# Patient Record
Sex: Female | Born: 1965 | Race: White | Hispanic: No | Marital: Married | Smoking: Current every day smoker
Health system: Southern US, Community
[De-identification: ages and names within clinical notes are randomized; demographics above are authoritative.]

## PROBLEM LIST (undated history)

## (undated) DIAGNOSIS — E785 Hyperlipidemia, unspecified: Secondary | ICD-10-CM

## (undated) HISTORY — DX: Hyperlipidemia, unspecified: E78.5

## (undated) HISTORY — PX: OTHER SURGICAL HISTORY: SHX169

---

## 2013-02-19 ENCOUNTER — Encounter: Payer: Self-pay | Admitting: Physician Assistant

## 2013-02-19 ENCOUNTER — Ambulatory Visit (INDEPENDENT_AMBULATORY_CARE_PROVIDER_SITE_OTHER): Payer: BC Managed Care – PPO | Admitting: Physician Assistant

## 2013-02-19 VITALS — BP 109/72 | HR 90 | Ht 64.0 in | Wt 145.0 lb

## 2013-02-19 DIAGNOSIS — N951 Menopausal and female climacteric states: Secondary | ICD-10-CM

## 2013-02-19 DIAGNOSIS — Z131 Encounter for screening for diabetes mellitus: Secondary | ICD-10-CM

## 2013-02-19 DIAGNOSIS — N393 Stress incontinence (female) (male): Secondary | ICD-10-CM

## 2013-02-19 DIAGNOSIS — Z1239 Encounter for other screening for malignant neoplasm of breast: Secondary | ICD-10-CM

## 2013-02-19 DIAGNOSIS — Z720 Tobacco use: Secondary | ICD-10-CM

## 2013-02-19 DIAGNOSIS — Z13220 Encounter for screening for lipoid disorders: Secondary | ICD-10-CM

## 2013-02-19 DIAGNOSIS — G4700 Insomnia, unspecified: Secondary | ICD-10-CM

## 2013-02-19 DIAGNOSIS — R232 Flushing: Secondary | ICD-10-CM

## 2013-02-19 DIAGNOSIS — F17200 Nicotine dependence, unspecified, uncomplicated: Secondary | ICD-10-CM

## 2013-02-19 MED ORDER — BUPROPION HCL ER (XL) 150 MG PO TB24: 150.0000 mg | ORAL_TABLET | Freq: Every day | ORAL | Status: DC

## 2013-02-19 NOTE — Patient Instructions (Addendum)
Melatonin 3mg  to 10mg  1 hour before bedtime.  Black cohosh  Menopause and Herbal Products Menopause is the normal time of life when menstrual periods stop completely. Menopause is complete when you have missed 12 consecutive menstrual periods. It usually occurs between the ages of 4648 to 4955, with an average age of 47. Very rarely does a woman develop menopause before 47 years old. At menopause, your ovaries stop producing the female hormones, estrogen and progesterone. This can cause undesirable symptoms and also affect your health. Sometimes the symptoms can occur 4 to 5 years before the menopause begins. There is no relationship between menopause and:  Oral contraceptives.  Number of children you had.  Race.  The age your menstrual periods started (menarche). Heavy smokers and very thin women may develop menopause earlier in life. Estrogen and progesterone hormone treatment is the usual method of treating menopausal symptoms. However, there are women who should not take hormone treatment. This is true of:   Women that have breast or uterine cancer.  Women who prefer not to take hormones because of certain side effects (abnormal uterine bleeding).  Women who are afraid that hormones may cause breast cancer.  Women who have a history of liver disease, heart disease, stroke, or blood clots. For these women, there are other medications that may help treat their menopausal symptoms. These medications are found in plants and botanical products. They can be found in the form of herbs, teas, oils, tinctures, and pills.  CAUSES:  The ovaries stop producing the female hormones estrogen and progesterone.  Other causes include:  Surgery to remove both ovaries.  The ovaries stop functioning for no know reason.  Tumors of the pituitary gland in the brain.  Medical disease that affects the ovaries and hormone production.  Radiation treatment to the abdomen or pelvis.  Chemotherapy that  affects the ovaries. PHYTOESTROGENS: Phytoestrogens occur naturally in plants and plant products. They act like estrogen in the body. Herbal medications are made from these plants and botanical steroids. There are 3 types of phytoestrogens:  Isoflavones (genistein and daidzein) are found in soy, garbanzo beans, miso and tofu foods.  Ligins are found in the shell of seeds. They are used to make oils like flaxseed oil. The bacteria in your intestine act on these foods to produce the estrogen-like hormones.  Coumestans are estrogen-like. Some of the foods they are found in include sunflower seeds and bean sprouts. CONDITIONS AND THEIR POSSIBLE HERBAL TREATMENT:  Hot flashes and night sweats.  Soy, black cohosh and evening primrose.  Irritability, insomnia, depression and memory problems.  Chasteberry, ginseng, and soy.  St. John's wort may be helpful for depression. However, there is a concern of it causing cataracts of the eye and may have bad effects on other medications. St. John's wort should not be taken for long time and without your caregiver's advice.  Loss of libido and vaginal and skin dryness.  Wild yam and soy.  Prevention of coronary heart disease and osteoporosis.  Soy and Isoflavones. Several studies have shown that some women benefit from herbal medications, but most of the studies have not consistently shown that these supplements are much better than placebo. Other forms of treatment to help women with menopausal symptoms include a balanced diet, rest, exercise, vitamin and calcium (with vitamin D) supplements, acupuncture, and group therapy when necessary. THOSE WHO SHOULD NOT TAKE HERBAL MEDICATIONS INCLUDE:  Women who are planning on getting pregnant unless told by your caregiver.  Women who are  breastfeeding unless told by your caregiver.  Women who are taking other prescription medications unless told by your caregiver.  Infants, children, and elderly women  unless told by your caregiver. Different herbal medications have different and unmeasured amounts of the herbal ingredients. There are no regulations, quality control, and standardization of the ingredients in herbal medications. Therefore, the amount of the ingredient in the medication may vary from one herb, pill, tea, oil or tincture to another. Many herbal medications can cause serious problems and can even have poisonous effects if taken too much or too long. If problems develop, the medication should be stopped and recorded by your caregiver. HOME CARE INSTRUCTIONS  Do not take or give children herbal medications without your caregiver's advice.  Let your caregiver know all the medications you are taking. This includes prescription, over-the-counter, eye drops, and creams.  Do not take herbal medications longer or more than recommended.  Tell your caregiver about any side effects from the medication. SEEK MEDICAL CARE IF:  You develop a fever of 102 F (38.9 C), or as directed by your caregiver.  You feel sick to your stomach (nauseous), vomit, or have diarrhea.  You develop a rash.  You develop abdominal pain.  You develop severe headaches.  You start to have vision problems.  You feel dizzy or faint.  You start to feel numbness in any part of your body.  You start shaking (have convulsions). Document Released: 07/19/2007 Document Revised: 01/17/2012 Document Reviewed: 02/15/2010 [facility] Patient Information 2014 [place], [place].   Insomnia Insomnia is frequent trouble falling and/or staying asleep. Insomnia can be a long term problem or a short term problem. Both are common. Insomnia can be a short term problem when the wakefulness is related to a certain stress or worry. Long term insomnia is often related to ongoing stress during waking hours and/or poor sleeping habits. Overtime, sleep deprivation itself can make the problem worse. Every little thing feels more severe  because you are overtired and your ability to cope is decreased. CAUSES   Stress, anxiety, and depression.  Poor sleeping habits.  Distractions such as TV in the bedroom.  Naps close to bedtime.  Engaging in emotionally charged conversations before bed.  Technical reading before sleep.  Alcohol and other sedatives. They may make the problem worse. They can hurt normal sleep patterns and normal dream activity.  Stimulants such as caffeine for several hours prior to bedtime.  Pain syndromes and shortness of breath can cause insomnia.  Exercise late at night.  Changing time zones may cause sleeping problems (jet lag). It is sometimes helpful to have someone observe your sleeping patterns. They should look for periods of not breathing during the night (sleep apnea). They should also look to see how long those periods last. If you live alone or observers are uncertain, you can also be observed at a sleep clinic where your sleep patterns will be professionally monitored. Sleep apnea requires a checkup and treatment. Give your caregivers your medical history. Give your caregivers observations your family has made about your sleep.  SYMPTOMS   Not feeling rested in the morning.  Anxiety and restlessness at bedtime.  Difficulty falling and staying asleep. TREATMENT   Your caregiver may prescribe treatment for an underlying medical disorders. Your caregiver can give advice or help if you are using alcohol or other drugs for self-medication. Treatment of underlying problems will usually eliminate insomnia problems.  Medications can be prescribed for short time use. They are generally not  recommended for lengthy use.  Over-the-counter sleep medicines are not recommended for lengthy use. They can be habit forming.  You can promote easier sleeping by making lifestyle changes such as:  Using relaxation techniques that help with breathing and reduce muscle tension.  Exercising earlier in  the day.  Changing your diet and the time of your last meal. No night time snacks.  Establish a regular time to go to bed.  Counseling can help with stressful problems and worry.  Soothing music and white noise may be helpful if there are background noises you cannot remove.  Stop tedious detailed work at least one hour before bedtime. HOME CARE INSTRUCTIONS   Keep a diary. Inform your caregiver about your progress. This includes any medication side effects. See your caregiver regularly. Take note of:  Times when you are asleep.  Times when you are awake during the night.  The quality of your sleep.  How you feel the next day. This information will help your caregiver care for you.  Get out of bed if you are still awake after 15 minutes. Read or do some quiet activity. Keep the lights down. Wait until you feel sleepy and go back to bed.  Keep regular sleeping and waking hours. Avoid naps.  Exercise regularly.  Avoid distractions at bedtime. Distractions include watching television or engaging in any intense or detailed activity like attempting to balance the household checkbook.  Develop a bedtime ritual. Keep a familiar routine of bathing, brushing your teeth, climbing into bed at the same time each night, listening to soothing music. Routines increase the success of falling to sleep faster.  Use relaxation techniques. This can be using breathing and muscle tension release routines. It can also include visualizing peaceful scenes. You can also help control troubling or intruding thoughts by keeping your mind occupied with boring or repetitive thoughts like the old concept of counting sheep. You can make it more creative like imagining planting one beautiful flower after another in your backyard garden.  During your day, work to eliminate stress. When this is not possible use some of the previous suggestions to help reduce the anxiety that accompanies stressful situations. MAKE  SURE YOU:   Understand these instructions.  Will watch your condition.  Will get help right away if you are not doing well or get worse. Document Released: 01/28/2000 Document Revised: 04/24/2011 Document Reviewed: 02/27/2007 [facility] Patient Information 2014 [place], [place].

## 2013-02-21 ENCOUNTER — Encounter: Payer: Self-pay | Admitting: Physician Assistant

## 2013-02-21 ENCOUNTER — Other Ambulatory Visit (HOSPITAL_COMMUNITY)
Admission: RE | Admit: 2013-02-21 | Discharge: 2013-02-21 | Disposition: A | Discharge status: Home / Self Care | Payer: BC Managed Care – PPO | Source: Ambulatory Visit | Attending: Family Medicine | Admitting: Family Medicine

## 2013-02-21 ENCOUNTER — Ambulatory Visit (INDEPENDENT_AMBULATORY_CARE_PROVIDER_SITE_OTHER): Payer: BC Managed Care – PPO | Admitting: Physician Assistant

## 2013-02-21 VITALS — BP 120/67 | HR 90 | Wt 145.0 lb

## 2013-02-21 DIAGNOSIS — F17200 Nicotine dependence, unspecified, uncomplicated: Secondary | ICD-10-CM

## 2013-02-21 DIAGNOSIS — Z0000 Encounter for general adult medical examination without abnormal findings: Secondary | ICD-10-CM

## 2013-02-21 DIAGNOSIS — Z01419 Encounter for gynecological examination (general) (routine) without abnormal findings: Secondary | ICD-10-CM | POA: Insufficient documentation

## 2013-02-21 DIAGNOSIS — Z1239 Encounter for other screening for malignant neoplasm of breast: Secondary | ICD-10-CM

## 2013-02-21 DIAGNOSIS — Z1151 Encounter for screening for human papillomavirus (HPV): Secondary | ICD-10-CM | POA: Insufficient documentation

## 2013-02-21 DIAGNOSIS — Z23 Encounter for immunization: Secondary | ICD-10-CM

## 2013-02-21 LAB — LIPID PANEL
CHOL/HDL RATIO: 5.8 ratio
Cholesterol: 248 mg/dL — ABNORMAL HIGH (ref 0–200)
HDL: 43 mg/dL (ref >39)
LDL CALC: 155 mg/dL — AB (ref 0–99)
Triglycerides: 252 mg/dL — ABNORMAL HIGH (ref <150)
VLDL: 50 mg/dL — ABNORMAL HIGH (ref 0–40)

## 2013-02-21 LAB — COMPLETE METABOLIC PANEL WITH GFR
ALK PHOS: 66 U/L (ref 39–117)
ALT: 9 U/L (ref 0–35)
AST: 13 U/L (ref 0–37)
Albumin: 4.3 g/dL (ref 3.5–5.2)
BILIRUBIN TOTAL: 0.5 mg/dL (ref 0.3–1.2)
BUN: 18 mg/dL (ref 6–23)
CO2: 29 mEq/L (ref 19–32)
CREATININE: 0.78 mg/dL (ref 0.50–1.10)
Calcium: 9.6 mg/dL (ref 8.4–10.5)
Chloride: 104 mEq/L (ref 96–112)
GFR, Est African American: >89 mL/min
GFR, Est Non African American: >89 mL/min
Glucose, Bld: 100 mg/dL — ABNORMAL HIGH (ref 70–99)
Potassium: 4 mEq/L (ref 3.5–5.3)
SODIUM: 139 meq/L (ref 135–145)
TOTAL PROTEIN: 7 g/dL (ref 6.0–8.3)

## 2013-02-21 NOTE — Progress Notes (Signed)
  Subjective:     Yetta GlassmanSusan Ken is a 48 y.o. female and is here for a comprehensive physical exam. The patient reports no problems.  History   Social History  . Marital Status: Married    Spouse Name: N/A    Number of Children: N/A  . Years of Education: N/A   Occupational History  . Not on file.   Social History Main Topics  . Smoking status: Current Every Day Smoker  . Smokeless tobacco: Not on file  . Alcohol Use: No  . Drug Use: Yes  . Sexual Activity: Not Currently   Other Topics Concern  . Not on file   Social History Narrative  . No narrative on file   Health Maintenance  Topic Date Due  . Influenza Vaccine  02/21/2014  . Pap Smear  02/22/2016  . Tetanus/tdap  02/22/2023    The following portions of the patient's history were reviewed and updated as appropriate: allergies, current medications, past family history, past medical history, past social history, past surgical history and problem list.  Review of Systems A comprehensive review of systems was negative.   Objective:    BP 120/67  Pulse 90  Wt 145 lb (65.772 kg)  LMP 02/11/2013 General appearance: alert, cooperative and appears older than stated age Head: Normocephalic, without obvious abnormality, atraumatic Eyes: conjunctivae/corneas clear. PERRL, EOM's intact. Fundi benign., exam for left eye only. right eye is glass.  Ears: normal TM's and external ear canals both ears Nose: Nares normal. Septum midline. Mucosa normal. No drainage or sinus tenderness. Throat: lips, mucosa, and tongue normal; teeth and gums normal Neck: no adenopathy, no carotid bruit, no JVD, supple, symmetrical, trachea midline and thyroid not enlarged, symmetric, no tenderness/mass/nodules Back: symmetric, no curvature. ROM normal. No CVA tenderness. Lungs: clear to auscultation bilaterally Breasts: normal appearance, no masses or tenderness Heart: regular rate and rhythm, S1, S2 normal, no murmur, click, rub or  gallop Abdomen: soft, non-tender; bowel sounds normal; no masses,  no organomegaly Pelvic: cervix normal in appearance, external genitalia normal, no adnexal masses or tenderness, no cervical motion tenderness, uterus normal size, shape, and consistency and vagina normal without discharge Extremities: extremities normal, atraumatic, no cyanosis or edema Pulses: 2+ and symmetric Skin: Skin color, texture, turgor normal. No rashes or lesions Lymph nodes: Cervical, supraclavicular, and axillary nodes normal. Neurologic: Grossly normal    Assessment:    Healthy female exam.      Plan:    CpE- Pap smear done today. Mammogram is scheduled for Tuesday. Tdap given today. We'll call with screening lab results. Will get chest x-ray due to 30 years of smoking. Encouraged calcium 1200 mg M.D. 800 mg. Discussed bone denisity in next year since mother had early osteoporosis. Pt screening 9 years ago and looked great. Continue on wellbutrin for smoking cessation. Call if would like to discuss other options.   See After Visit Summary for Counseling Recommendations
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
## 2013-02-21 NOTE — Patient Instructions (Addendum)
Keeping You Healthy  Get These Tests 1. Blood Pressure- Have your blood pressure checked once a year by your health care provider.  Normal blood pressure is 120/80. 2. Weight- Have your body mass index (BMI) calculated to screen for obesity.  BMI is measure of body fat based on height and weight.  You can also calculate your own BMI at www.nhlbisupport.com/bmi/. 3. Cholesterol- Have your cholesterol checked every 5 years starting at age 47 then yearly starting at age 45. 4. Chlamydia, HIV, and other sexually transmitted diseases- Get screened every year until age 25, then within three months of each new sexual provider. 5. Pap Smear- Every 1-3 years; discuss with your health care provider. 6. Mammogram- Every year starting at age 40  Take these medicines  Calcium with Vitamin D-Your body needs 1200 mg of Calcium each day and 800-1000 IU of Vitamin D daily.  Your body can only absorb 500 mg of Calcium at a time so Calcium must be taken in 2 or 3 divided doses throughout the day.  Multivitamin with folic acid- Once daily if it is possible for you to become pregnant.  Get these Immunizations  Tetanus shot- Every 10 years.  Flu shot-Every year.  Take these steps 1. Do not smoke-Your healthcare provider can help you quit.  For tips on how to quit go to www.smokefree.gov or call 1-800 QUITNOW. 2. Be physically active- Exercise 5 days a week for at least 30 minutes.  If you are not already physically active, start slow and gradually work up to 30 minutes of moderate physical activity.  Examples of moderate activity include walking briskly, dancing, swimming, bicycling, etc. 3. Breast Cancer- A self breast exam every month is important for early detection of breast cancer.  For more information and instruction on self breast exams, ask your healthcare provider or www.womenshealth.gov/faq/breast-self-exam.cfm. 4. Eat a healthy diet- Eat a variety of healthy foods such as fruits, vegetables, whole  grains, low fat milk, low fat cheeses, yogurt, lean meats, poultry and fish, beans, nuts, tofu, etc.  For more information go to www. Thenutritionsource.org 5. Drink alcohol in moderation- Limit alcohol intake to one drink or less per day. Never drink and drive. 6. Depression- Your emotional health is as important as your physical health.  If you're feeling down or losing interest in things you normally enjoy please talk to your healthcare provider about being screened for depression. 7. Dental visit- Brush and floss your teeth twice daily; visit your dentist twice a year. 8. Eye doctor- Get an eye exam at least every 2 years. 9. Helmet use- Always wear a helmet when riding a bicycle, motorcycle, rollerblading or skateboarding. 10. Safe sex- If you may be exposed to sexually transmitted infections, use a condom. 11. Seat belts- Seat belts can save your live; always wear one. 12. Smoke/Carbon Monoxide detectors- These detectors need to be installed on the appropriate level of your home. Replace batteries at least once a year. 13. Skin cancer- When out in the sun please cover up and use sunscreen 15 SPF or higher. 14. Violence- If anyone is threatening or hurting you, please tell your healthcare provider.        

## 2013-02-24 ENCOUNTER — Other Ambulatory Visit: Payer: Self-pay | Admitting: Physician Assistant

## 2013-02-24 MED ORDER — PRAVASTATIN SODIUM 40 MG PO TABS: 40.0000 mg | ORAL_TABLET | Freq: Every day | ORAL | Status: DC

## 2013-02-25 ENCOUNTER — Ambulatory Visit (INDEPENDENT_AMBULATORY_CARE_PROVIDER_SITE_OTHER): Payer: BC Managed Care – PPO

## 2013-02-25 DIAGNOSIS — R7301 Impaired fasting glucose: Secondary | ICD-10-CM

## 2013-02-25 DIAGNOSIS — F17200 Nicotine dependence, unspecified, uncomplicated: Secondary | ICD-10-CM

## 2013-02-25 DIAGNOSIS — Z1239 Encounter for other screening for malignant neoplasm of breast: Secondary | ICD-10-CM

## 2013-10-28 ENCOUNTER — Encounter: Payer: Self-pay | Admitting: Family Medicine

## 2013-10-28 ENCOUNTER — Ambulatory Visit (INDEPENDENT_AMBULATORY_CARE_PROVIDER_SITE_OTHER): Payer: BC Managed Care – PPO | Admitting: Family Medicine

## 2013-10-28 VITALS — BP 112/74 | HR 99 | Wt 142.0 lb

## 2013-10-28 DIAGNOSIS — J019 Acute sinusitis, unspecified: Secondary | ICD-10-CM

## 2013-10-28 DIAGNOSIS — Z970 Presence of artificial eye: Secondary | ICD-10-CM | POA: Diagnosis not present

## 2013-10-28 MED ORDER — AZITHROMYCIN 250 MG PO TABS: ORAL_TABLET | ORAL | Status: DC

## 2013-10-28 NOTE — Patient Instructions (Signed)

## 2013-10-28 NOTE — Progress Notes (Signed)
   Subjective:    Patient ID: Stacie Hanson, female    DOB: [birth year], 48 y.o.   MRN: [id number]  HPI Last week her allergies started flaring.  Took some benadryl a few days.  Has been having post nasal drip. Says getting yellow drainage out of her nose.  She feel like she is having green d/c from her eye socket.  She has a prosthesis  Some sweats. No fever.  + post nasal drip.  Facial pressure that throbs and is worse when she bends over. She did take some NyQuil last night and this morning and says she's also been using nasal saline.   Review of Systems     Objective:   Physical Exam  Constitutional: She is oriented to person, place, and time. She appears well-developed and well-nourished.  HENT:  Head: Normocephalic and atraumatic.  Right Ear: External ear normal.  Left Ear: External ear normal.  Nose: Nose normal.  Mouth/Throat: Oropharynx is clear and moist.  TMs and canals are clear. Right upper eyelid swollen. No discharge seen. Tender over the bilat maxillary sinuses and frontal sinuses.  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  Eyes: Conjunctivae and EOM are normal. Pupils are equal, round, and reactive to light. Right eye exhibits no discharge. Left eye exhibits no discharge.  Neck: Neck supple. No thyromegaly present.  Cardiovascular: Normal rate, regular rhythm and normal heart sounds.   Pulmonary/Chest: Effort normal and breath sounds normal. She has no wheezes.  Lymphadenopathy:    She has no cervical adenopathy.  Neurological: She is alert and oriented to person, place, and time.  Skin: Skin is warm and dry.  Psychiatric: She has a normal mood and affect.          Assessment & Plan:  Acute sinusitis-will treat with azithromycin. Call if not significantly better in one week. Continue symptomatic care.  Allergic rhinitis-recommend a longer acting antihistamine such as Claritin or Allegra to help control her symptoms instead of just Benadryl occasionally at night.  Especially since she tends to have follow allergies it classically turned into a sinus infection. We also discussed starting a nasal steroid spray such as Flonase could be helpful in study showed it does reduce recurrence of sinus infections.

## 2013-12-08 ENCOUNTER — Other Ambulatory Visit: Payer: Self-pay | Admitting: Physician Assistant

## 2014-01-19 ENCOUNTER — Other Ambulatory Visit: Payer: Self-pay | Admitting: Physician Assistant

## 2014-01-29 ENCOUNTER — Other Ambulatory Visit: Payer: Self-pay | Admitting: *Deleted

## 2014-01-29 MED ORDER — PRAVASTATIN SODIUM 40 MG PO TABS: ORAL_TABLET | ORAL | Status: DC

## 2014-02-18 ENCOUNTER — Encounter: Payer: BC Managed Care – PPO | Admitting: Physician Assistant

## 2014-02-18 ENCOUNTER — Telehealth: Payer: Self-pay | Admitting: *Deleted

## 2014-02-18 DIAGNOSIS — Z1231 Encounter for screening mammogram for malignant neoplasm of breast: Secondary | ICD-10-CM

## 2014-02-18 NOTE — Telephone Encounter (Signed)
Mammogram ordered for upcoming CPE.

## 2014-02-25 ENCOUNTER — Ambulatory Visit (INDEPENDENT_AMBULATORY_CARE_PROVIDER_SITE_OTHER): Payer: BLUE CROSS/BLUE SHIELD | Admitting: Physician Assistant

## 2014-02-25 ENCOUNTER — Ambulatory Visit: Payer: Self-pay

## 2014-02-25 ENCOUNTER — Encounter: Payer: Self-pay | Admitting: Physician Assistant

## 2014-02-25 VITALS — BP 105/67 | HR 98 | Ht 64.0 in | Wt 140.0 lb

## 2014-02-25 DIAGNOSIS — Z131 Encounter for screening for diabetes mellitus: Secondary | ICD-10-CM

## 2014-02-25 DIAGNOSIS — B001 Herpesviral vesicular dermatitis: Secondary | ICD-10-CM | POA: Insufficient documentation

## 2014-02-25 DIAGNOSIS — E785 Hyperlipidemia, unspecified: Secondary | ICD-10-CM | POA: Insufficient documentation

## 2014-02-25 DIAGNOSIS — Z0000 Encounter for general adult medical examination without abnormal findings: Secondary | ICD-10-CM

## 2014-02-25 DIAGNOSIS — Z970 Presence of artificial eye: Secondary | ICD-10-CM

## 2014-02-25 DIAGNOSIS — Z9189 Other specified personal risk factors, not elsewhere classified: Secondary | ICD-10-CM

## 2014-02-25 DIAGNOSIS — R5383 Other fatigue: Secondary | ICD-10-CM

## 2014-02-25 LAB — COMPLETE METABOLIC PANEL WITH GFR
ALK PHOS: 68 U/L (ref 39–117)
ALT: 13 U/L (ref 0–35)
AST: 15 U/L (ref 0–37)
Albumin: 4.1 g/dL (ref 3.5–5.2)
BILIRUBIN TOTAL: 0.6 mg/dL (ref 0.2–1.2)
BUN: 15 mg/dL (ref 6–23)
CO2: 29 meq/L (ref 19–32)
CREATININE: 0.8 mg/dL (ref 0.50–1.10)
Calcium: 9.4 mg/dL (ref 8.4–10.5)
Chloride: 104 mEq/L (ref 96–112)
GFR, EST NON AFRICAN AMERICAN: 88 mL/min
GLUCOSE: 96 mg/dL (ref 70–99)
Potassium: 4.5 mEq/L (ref 3.5–5.3)
Sodium: 140 mEq/L (ref 135–145)
Total Protein: 7.1 g/dL (ref 6.0–8.3)

## 2014-02-25 LAB — LIPID PANEL
CHOLESTEROL: 198 mg/dL (ref 0–200)
HDL: 49 mg/dL (ref >39)
LDL CALC: 127 mg/dL — AB (ref 0–99)
Total CHOL/HDL Ratio: 4 Ratio
Triglycerides: 111 mg/dL (ref <150)
VLDL: 22 mg/dL (ref 0–40)

## 2014-02-25 LAB — TSH: TSH: 1.776 u[IU]/mL (ref 0.350–4.500)

## 2014-02-25 LAB — VITAMIN B12: VITAMIN B 12: 321 pg/mL (ref 211–911)

## 2014-02-25 MED ORDER — VALACYCLOVIR HCL 1 G PO TABS: ORAL_TABLET | ORAL | Status: DC

## 2014-02-25 NOTE — Progress Notes (Signed)
Subjective:    Patient ID: Stacie GlassmanSusan Leas, female    DOB: 06/18/1965, 49 y.o.   MRN: [id number]  HPI    Review of Systems     Objective:   Physical Exam        Assessment & Plan:   Subjective:     Stacie GlassmanSusan Goertzen is a 49 y.o. female and is here for a comprehensive physical exam. The patient reports no problems.  Patient at the close of visit mentions that she seems tired most of the time. 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 She just wanted to make sure her labs looked okay. She reports she is sleeping well at night and wakes up feeling rested.  History   Social History  . Marital Status: Married    Spouse Name: N/A    Number of Children: N/A  . Years of Education: N/A   Occupational History  . Not on file.   Social History Main Topics  . Smoking status: Current Every Day Smoker  . Smokeless tobacco: Not on file  . Alcohol Use: No  . Drug Use: Yes  . Sexual Activity: Not Currently   Other Topics Concern  . Not on file   Social History Narrative   Health Maintenance  Topic Date Due  . MAMMOGRAM  02/25/2014  . INFLUENZA VACCINE  02/26/2015 (Originally 09/13/2013)  . PAP SMEAR  02/22/2016  . TETANUS/TDAP  02/22/2023    The following portions of the patient's history were reviewed and updated as appropriate: allergies, current medications, past family history, past medical history, past social history, past surgical history and problem list.  Review of Systems A comprehensive review of systems was negative.   Objective:    BP 105/67 mmHg  Pulse 98  Ht 5\' 4"  (1.626 m)  Wt 140 lb (63.504 kg)  BMI 24.02 kg/m2 General appearance: alert, cooperative and appears stated age Head: Normocephalic, without obvious abnormality, atraumatic Eyes: conjunctivae/corneas clear. PERRL, EOM's intact. Fundi benign. right eye is glass.  Ears: normal TM's and external ear canals both ears Nose: Nares normal. Septum midline. Mucosa normal. No drainage  or sinus tenderness. Throat: lips, mucosa, and tongue normal; teeth and gums normal Neck: no adenopathy, no carotid bruit, no JVD, supple, symmetrical, trachea midline and thyroid not enlarged, symmetric, no tenderness/mass/nodules Back: symmetric, no curvature. ROM normal. No CVA tenderness. Lungs: clear to auscultation bilaterally Heart: regular rate and rhythm, S1, S2 normal, no murmur, click, rub or gallop Abdomen: soft, non-tender; bowel sounds normal; no masses,  no organomegaly Extremities: extremities normal, atraumatic, no cyanosis or edema Pulses: 2+ and symmetric Skin: Skin color, texture, turgor normal. No rashes or lesions or lots of age spots on bilateral arms and face  crusted vesicle that has scabbed over on lower left lip.  Lymph nodes: Cervical, supraclavicular, and axillary nodes normal. Neurologic: Grossly normal    Assessment:    Healthy female exam.      Plan:    CPE- patient declines flu shot. Tdap up to date. Screening mammogram scheduled. Will add bone density since she is at risk for osteoporosis due to smoking in her family history. Discussed regular exercise at 150 minutes a week as well as vitamin D and calcium 800 units and 1200 mg daily. Handout given. Will screen for diabetes today.  Fatigue- briefly mention she is fatigued. We'll check vitamin D, B12 and thyroid.  Hyperlipidemia-we'll recheck cholesterol today. Patient is on Pravachol.  Fever blister-treated with Valtrex  a day. Gave her enough so that she could start at the beginning signs of fever blister.   See After Visit Summary for Counseling Recommendations

## 2014-02-25 NOTE — Patient Instructions (Signed)

## 2014-02-26 ENCOUNTER — Other Ambulatory Visit: Payer: Self-pay | Admitting: Physician Assistant

## 2014-02-26 LAB — VITAMIN D 25 HYDROXY (VIT D DEFICIENCY, FRACTURES): VIT D 25 HYDROXY: 30 ng/mL (ref 30–100)

## 2014-02-26 MED ORDER — PRAVASTATIN SODIUM 40 MG PO TABS: ORAL_TABLET | ORAL | Status: DC

## 2014-03-04 ENCOUNTER — Ambulatory Visit (INDEPENDENT_AMBULATORY_CARE_PROVIDER_SITE_OTHER): Payer: BLUE CROSS/BLUE SHIELD

## 2014-03-04 DIAGNOSIS — Z9189 Other specified personal risk factors, not elsewhere classified: Secondary | ICD-10-CM

## 2014-03-04 DIAGNOSIS — Z13820 Encounter for screening for osteoporosis: Secondary | ICD-10-CM

## 2014-03-04 DIAGNOSIS — Z1231 Encounter for screening mammogram for malignant neoplasm of breast: Secondary | ICD-10-CM

## 2015-04-19 ENCOUNTER — Other Ambulatory Visit: Payer: Self-pay | Admitting: Physician Assistant

## 2015-09-29 IMAGING — CR DG CHEST 2V
2 series · 2 of 2 positions shown · non-contrast
Comparison: None.

CLINICAL DATA: Tobacco use

EXAM:
CHEST  2 VIEW

[view not recorded (1 of 2)]
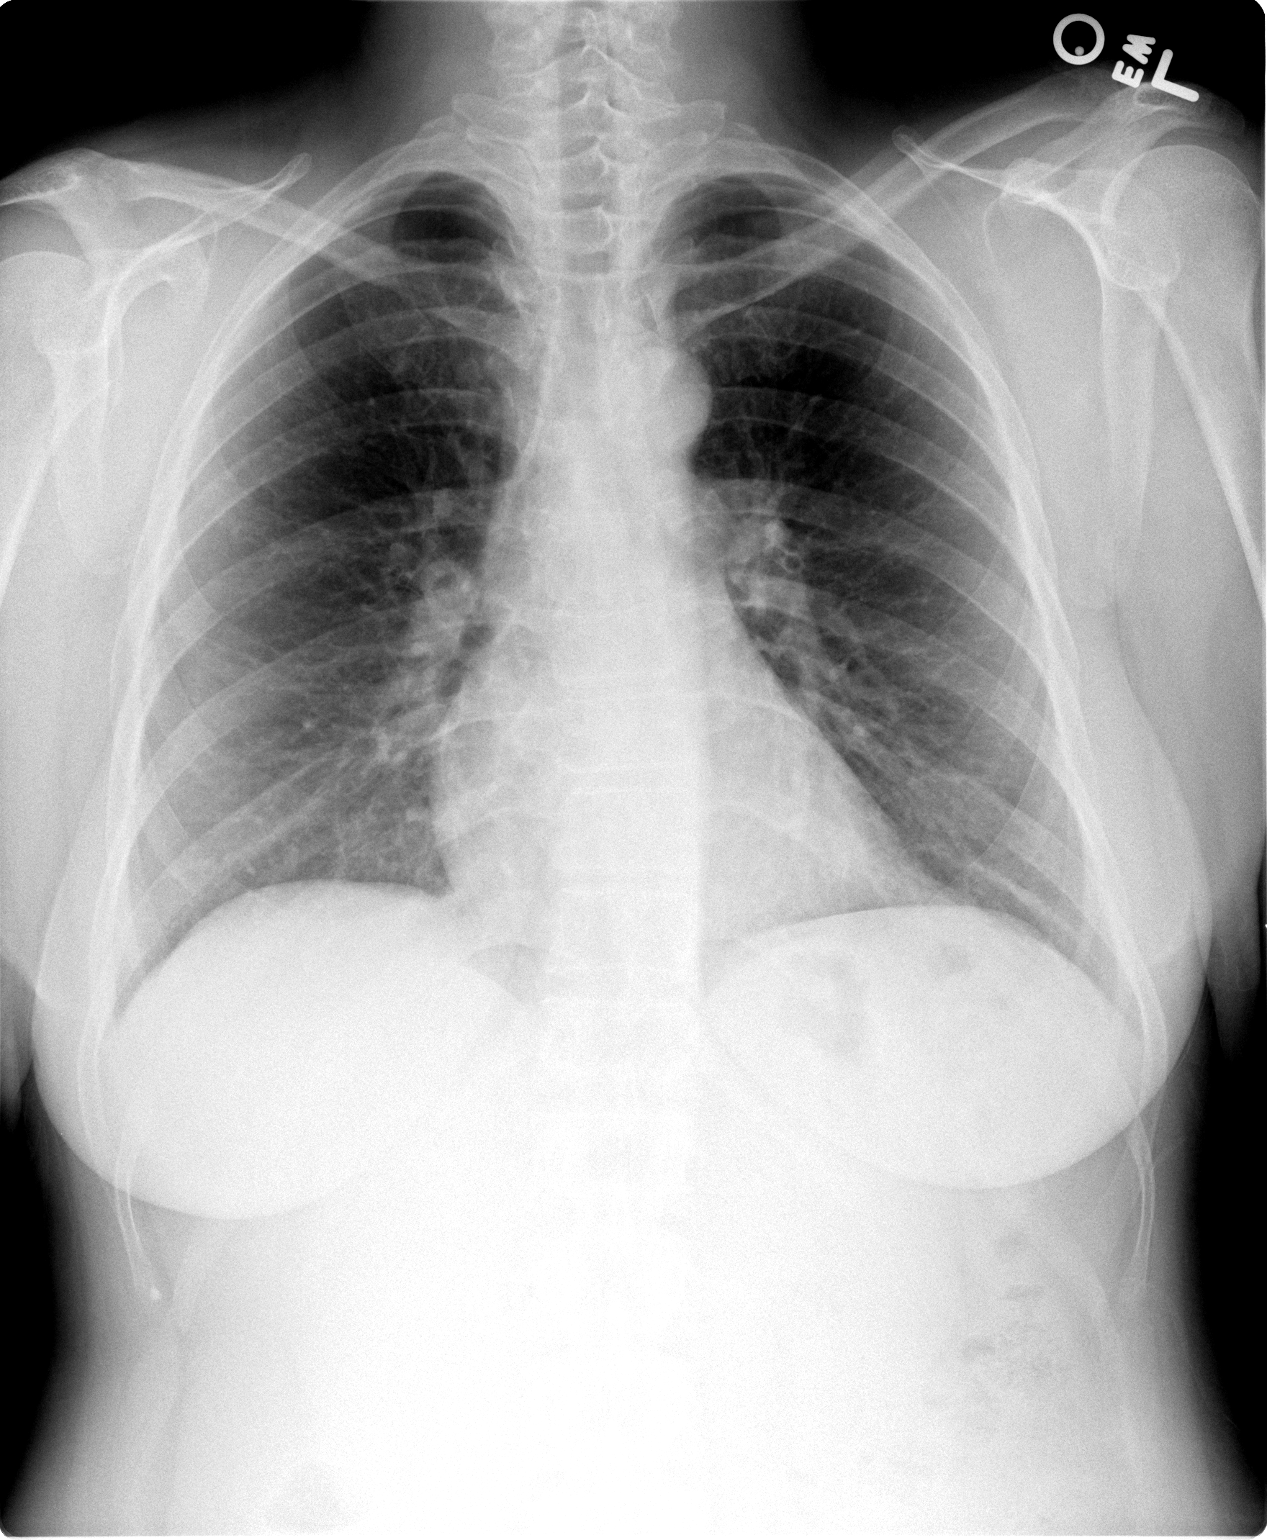

[view not recorded (2 of 2)]
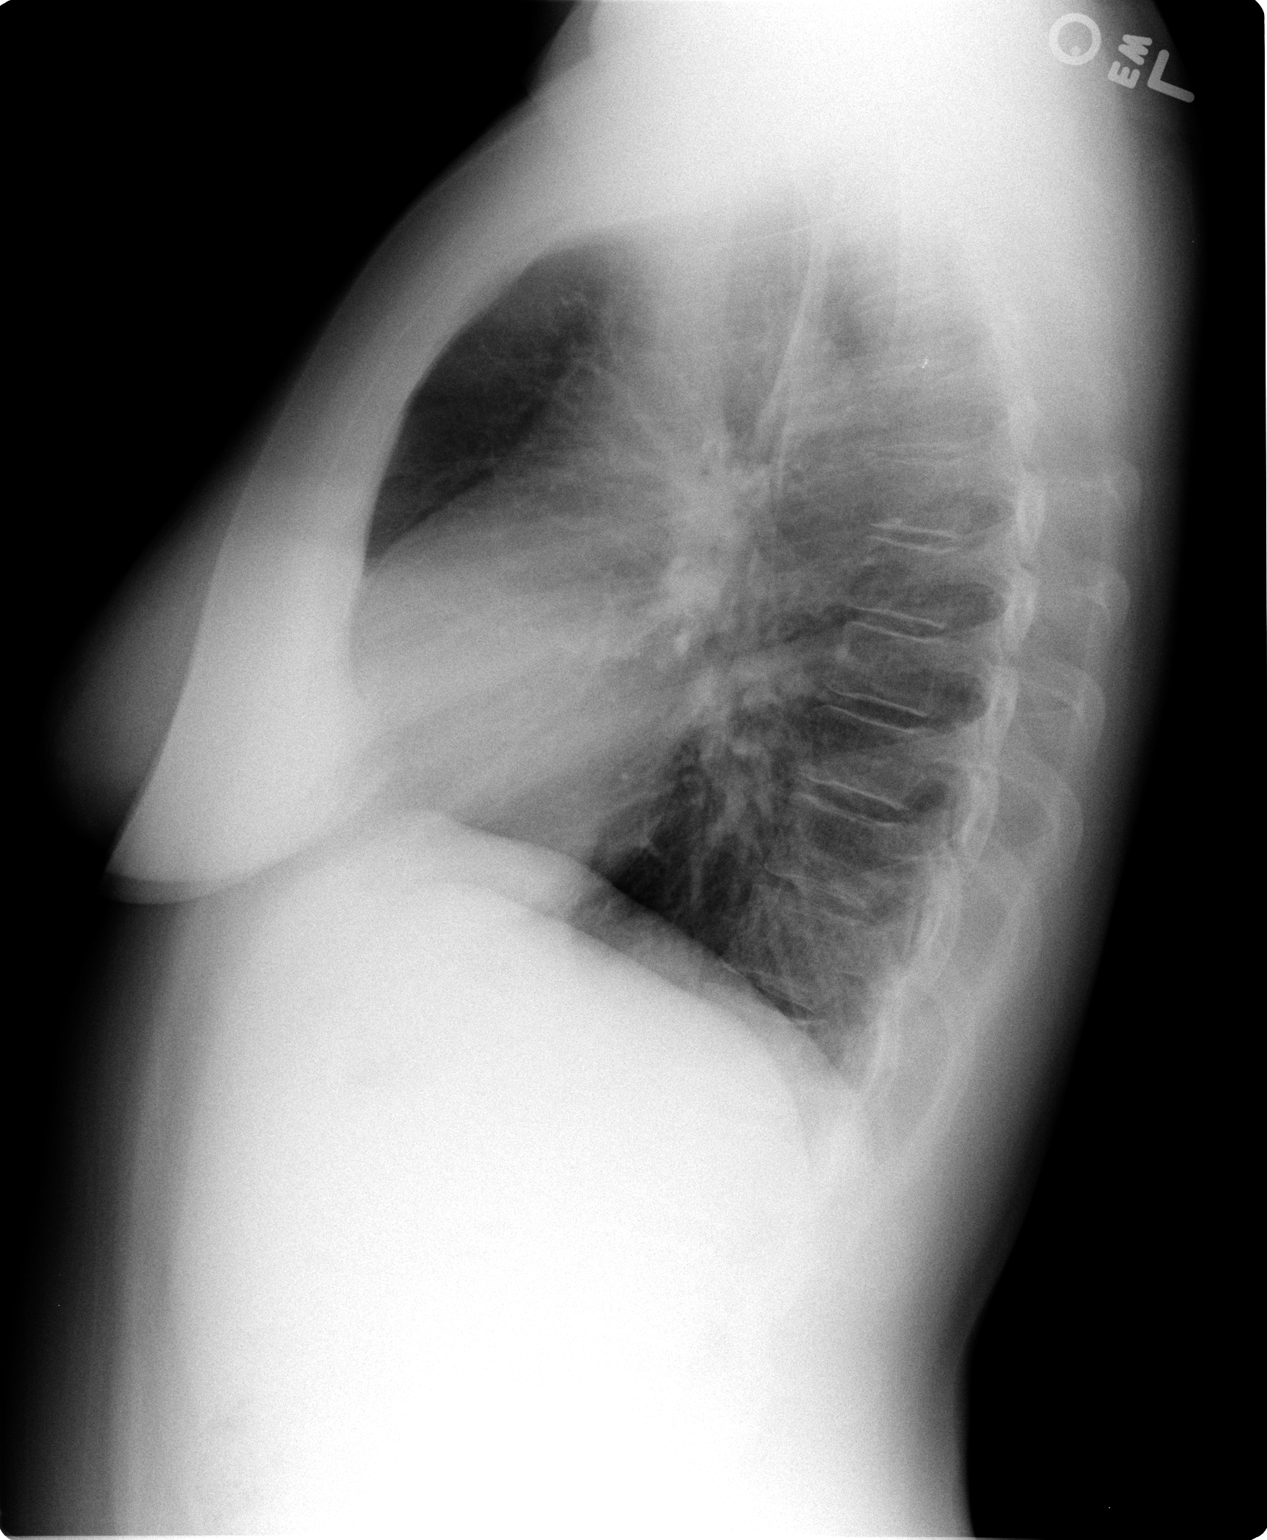

[2 of 2 positions shown; findings below may reference images not displayed]

FINDINGS: The lungs appear clear.  Cardiac and mediastinal contours normal.

No pleural effusion identified.
IMPRESSION: No active cardiopulmonary disease.

## 2016-03-08 ENCOUNTER — Encounter: Payer: Self-pay | Admitting: Physician Assistant

## 2016-03-08 ENCOUNTER — Ambulatory Visit (INDEPENDENT_AMBULATORY_CARE_PROVIDER_SITE_OTHER): Payer: BLUE CROSS/BLUE SHIELD

## 2016-03-08 ENCOUNTER — Other Ambulatory Visit (HOSPITAL_COMMUNITY)
Admission: RE | Admit: 2016-03-08 | Discharge: 2016-03-08 | Disposition: A | Discharge status: Home / Self Care | Payer: BLUE CROSS/BLUE SHIELD | Source: Ambulatory Visit | Attending: Physician Assistant | Admitting: Physician Assistant

## 2016-03-08 ENCOUNTER — Ambulatory Visit (INDEPENDENT_AMBULATORY_CARE_PROVIDER_SITE_OTHER): Payer: BLUE CROSS/BLUE SHIELD | Admitting: Physician Assistant

## 2016-03-08 VITALS — BP 132/83 | HR 99 | Wt 148.0 lb

## 2016-03-08 DIAGNOSIS — Z1231 Encounter for screening mammogram for malignant neoplasm of breast: Secondary | ICD-10-CM | POA: Diagnosis not present

## 2016-03-08 DIAGNOSIS — Z0000 Encounter for general adult medical examination without abnormal findings: Secondary | ICD-10-CM

## 2016-03-08 DIAGNOSIS — Z113 Encounter for screening for infections with a predominantly sexual mode of transmission: Secondary | ICD-10-CM | POA: Diagnosis present

## 2016-03-08 DIAGNOSIS — E785 Hyperlipidemia, unspecified: Secondary | ICD-10-CM | POA: Diagnosis not present

## 2016-03-08 DIAGNOSIS — Z23 Encounter for immunization: Secondary | ICD-10-CM

## 2016-03-08 DIAGNOSIS — Z1151 Encounter for screening for human papillomavirus (HPV): Secondary | ICD-10-CM | POA: Insufficient documentation

## 2016-03-08 DIAGNOSIS — M25542 Pain in joints of left hand: Secondary | ICD-10-CM

## 2016-03-08 DIAGNOSIS — Z01419 Encounter for gynecological examination (general) (routine) without abnormal findings: Secondary | ICD-10-CM | POA: Insufficient documentation

## 2016-03-08 DIAGNOSIS — L814 Other melanin hyperpigmentation: Secondary | ICD-10-CM

## 2016-03-08 DIAGNOSIS — M25541 Pain in joints of right hand: Secondary | ICD-10-CM

## 2016-03-08 DIAGNOSIS — Z1321 Encounter for screening for nutritional disorder: Secondary | ICD-10-CM | POA: Diagnosis not present

## 2016-03-08 DIAGNOSIS — Z1239 Encounter for other screening for malignant neoplasm of breast: Secondary | ICD-10-CM

## 2016-03-08 DIAGNOSIS — Z124 Encounter for screening for malignant neoplasm of cervix: Secondary | ICD-10-CM

## 2016-03-08 DIAGNOSIS — L821 Other seborrheic keratosis: Secondary | ICD-10-CM

## 2016-03-08 LAB — CBC WITH DIFFERENTIAL/PLATELET
BASOS ABS: 79 {cells}/uL (ref 0–200)
BASOS PCT: 1 %
EOS ABS: 474 {cells}/uL (ref 15–500)
Eosinophils Relative: 6 %
HCT: 40.9 % (ref 35.0–45.0)
HEMOGLOBIN: 13.6 g/dL (ref 11.7–15.5)
Lymphocytes Relative: 29 %
Lymphs Abs: 2291 cells/uL (ref 850–3900)
MCH: 31.4 pg (ref 27.0–33.0)
MCHC: 33.3 g/dL (ref 32.0–36.0)
MCV: 94.5 fL (ref 80.0–100.0)
MONOS PCT: 8 %
MPV: 10.3 fL (ref 7.5–12.5)
Monocytes Absolute: 632 cells/uL (ref 200–950)
Neutro Abs: 4424 cells/uL (ref 1500–7800)
Neutrophils Relative %: 56 %
PLATELETS: 240 10*3/uL (ref 140–400)
RBC: 4.33 MIL/uL (ref 3.80–5.10)
RDW: 12.9 % (ref 11.0–15.0)
WBC: 7.9 10*3/uL (ref 3.8–10.8)

## 2016-03-08 LAB — LIPID PANEL
Cholesterol: 254 mg/dL — ABNORMAL HIGH (ref <200)
HDL: 53 mg/dL (ref >50)
LDL CALC: 178 mg/dL — AB (ref <100)
Total CHOL/HDL Ratio: 4.8 Ratio (ref <5.0)
Triglycerides: 117 mg/dL (ref <150)
VLDL: 23 mg/dL (ref <30)

## 2016-03-08 LAB — COMPLETE METABOLIC PANEL WITH GFR
ALBUMIN: 4 g/dL (ref 3.6–5.1)
ALK PHOS: 66 U/L (ref 33–115)
ALT: 12 U/L (ref 6–29)
AST: 14 U/L (ref 10–35)
BUN: 14 mg/dL (ref 7–25)
CO2: 26 mmol/L (ref 20–31)
CREATININE: 0.73 mg/dL (ref 0.50–1.10)
Calcium: 9.3 mg/dL (ref 8.6–10.2)
Chloride: 106 mmol/L (ref 98–110)
GFR, Est Non African American: >89 mL/min (ref >=60)
GLUCOSE: 92 mg/dL (ref 65–99)
Potassium: 4.3 mmol/L (ref 3.5–5.3)
SODIUM: 141 mmol/L (ref 135–146)
Total Bilirubin: 0.6 mg/dL (ref 0.2–1.2)
Total Protein: 6.8 g/dL (ref 6.1–8.1)

## 2016-03-08 LAB — VITAMIN B12: VITAMIN B 12: 246 pg/mL (ref 200–1100)

## 2016-03-08 MED ORDER — PRAVASTATIN SODIUM 40 MG PO TABS: 40.0000 mg | ORAL_TABLET | Freq: Every day | ORAL | 4 refills | Status: DC

## 2016-03-08 NOTE — Progress Notes (Signed)
Call pt: normal mammogram. Follow up in one year.

## 2016-03-08 NOTE — Patient Instructions (Addendum)

## 2016-03-08 NOTE — Progress Notes (Signed)
Subjective:    Patient ID: Stacie Hanson, female    DOB: August 29, 1965, 51 y.o.   MRN: [id number]  HPI    Review of Systems     Objective:   Physical Exam        Assessment & Plan:   Subjective:     Stacie Hanson is a 51 y.o. female and is here for a comprehensive physical exam. The patient reports problems - she is having some bilateral pain in joints of fingers. she wonders what she can do to help this pain. it is ongoing but worse some days than others.  she also has a lot of brown spots all over her skin and 2 spots on her face that concern her because growing. She would like dermatology referral.   Social History   Social History  . Marital status: Married    Spouse name: N/A  . Number of children: N/A  . Years of education: N/A   Occupational History  . Not on file.   Social History Main Topics  . Smoking status: Current Every Day Smoker    Types: E-cigarettes  . Smokeless tobacco: Never Used  . Alcohol use No  . Drug use: Yes  . Sexual activity: Not Currently   Other Topics Concern  . Not on file   Social History Narrative  . No narrative on file   Health Maintenance  Topic Date Due  . HIV Screening  05/16/1981  . MAMMOGRAM  03/05/2015  . INFLUENZA VACCINE  09/14/2015  . PAP SMEAR  02/22/2016  . TETANUS/TDAP  02/22/2023    The following portions of the patient's history were reviewed and updated as appropriate: allergies, current medications, past family history, past medical history, past social history, past surgical history and problem list.  Review of Systems Pertinent items noted in HPI and remainder of comprehensive ROS otherwise negative.   Objective:    BP 132/83   Pulse 99   Wt 148 lb (67.1 kg)   BMI 25.40 kg/m  General appearance: alert, cooperative and appears stated age Head: Normocephalic, without obvious abnormality, atraumatic Eyes: conjunctivae/corneas clear. PERRL, EOM's intact. Fundi benign., right eye prosthetic.  Ears:  normal TM's and external ear canals both ears Nose: Nares normal. Septum midline. Mucosa normal. No drainage or sinus tenderness. Throat: lips, mucosa, and tongue normal; teeth and gums normal Neck: no adenopathy, no carotid bruit, no JVD, supple, symmetrical, trachea midline and thyroid not enlarged, symmetric, no tenderness/mass/nodules Back: symmetric, no curvature. ROM normal. No CVA tenderness. Lungs: clear to auscultation bilaterally Breasts: normal appearance, no masses or tenderness Heart: regular rate and rhythm, S1, S2 normal, no murmur, click, rub or gallop Abdomen: soft, non-tender; bowel sounds normal; no masses,  no organomegaly Pelvic: cervix normal in appearance, external genitalia normal, no adnexal masses or tenderness, no cervical motion tenderness, uterus normal size, shape, and consistency and vagina normal without discharge Extremities: extremities normal, atraumatic, no cyanosis or edema Pulses: 2+ and symmetric Skin: Skin color, texture, turgor normal. No rashes or lesions or multple raised brown wart like patches all over body. 2 brown macules over left eye brow and left cheek. left cheek macule has variation in color.  Lymph nodes: Cervical, supraclavicular, and axillary nodes normal. Neurologic: Alert and oriented X 3, normal strength and tone. Normal symmetric reflexes. Normal coordination and gait    Assessment:    Healthy female exam.      Plan:   Marland KitchenMarland KitchenCerise was seen today for annual exam.  Diagnoses and  all orders for this visit:  Annual physical exam -     Cytology - PAP -     MM Digital Screening; Future -     Flu Vaccine QUAD 36+ mos PF IM (Fluarix & Fluzone Quad PF) -     pravastatin (PRAVACHOL) 40 MG tablet; Take 1 tablet (40 mg total) by mouth daily. -     CBC with Differential -     COMPLETE METABOLIC PANEL WITH GFR -     Lipid Profile -     B12 -     MM Digital Screening  Breast cancer screening -     MM Digital Screening; Future -     MM  Digital Screening  Need for prophylactic vaccination and inoculation against influenza -     Flu Vaccine QUAD 36+ mos PF IM (Fluarix & Fluzone Quad PF)  Pap smear for cervical cancer screening -     Cytology - PAP  Hyperlipidemia, unspecified hyperlipidemia type -     Lipid Profile  Encounter for vitamin deficiency screening -     B12  Seborrheic keratosis -     Ambulatory referral to Dermatology  Solar lentigo -     Ambulatory referral to Dermatology  Arthralgia of both hands   Discussed tumeric 500mg  bid and or glucosamine chondrotin. Discussed NSAIDs as needed. Follow up as needed.  Mammogram ordered.  PAP done today.  See After Visit Summary for Counseling Recommendations

## 2016-03-10 LAB — CYTOLOGY - PAP
Bacterial vaginitis: NEGATIVE
CANDIDA VAGINITIS: NEGATIVE
Chlamydia: NEGATIVE
Diagnosis: NEGATIVE
HPV: NOT DETECTED
NEISSERIA GONORRHEA: NEGATIVE
TRICH (WINDOWPATH): NEGATIVE

## 2016-03-14 LAB — CERVICOVAGINAL ANCILLARY ONLY: Herpes: NEGATIVE

## 2016-10-05 IMAGING — DX DG DXA BONE DENSITY STUDY HL7
3 series · 3 of 3 positions shown · non-contrast
Comparison: None.

CLINICAL DATA: Premenopausal osteoporosis screening.

EXAM:
DUAL X-RAY ABSORPTIOMETRY (DXA) FOR BONE MINERAL DENSITY

[view not recorded (1 of 3)]
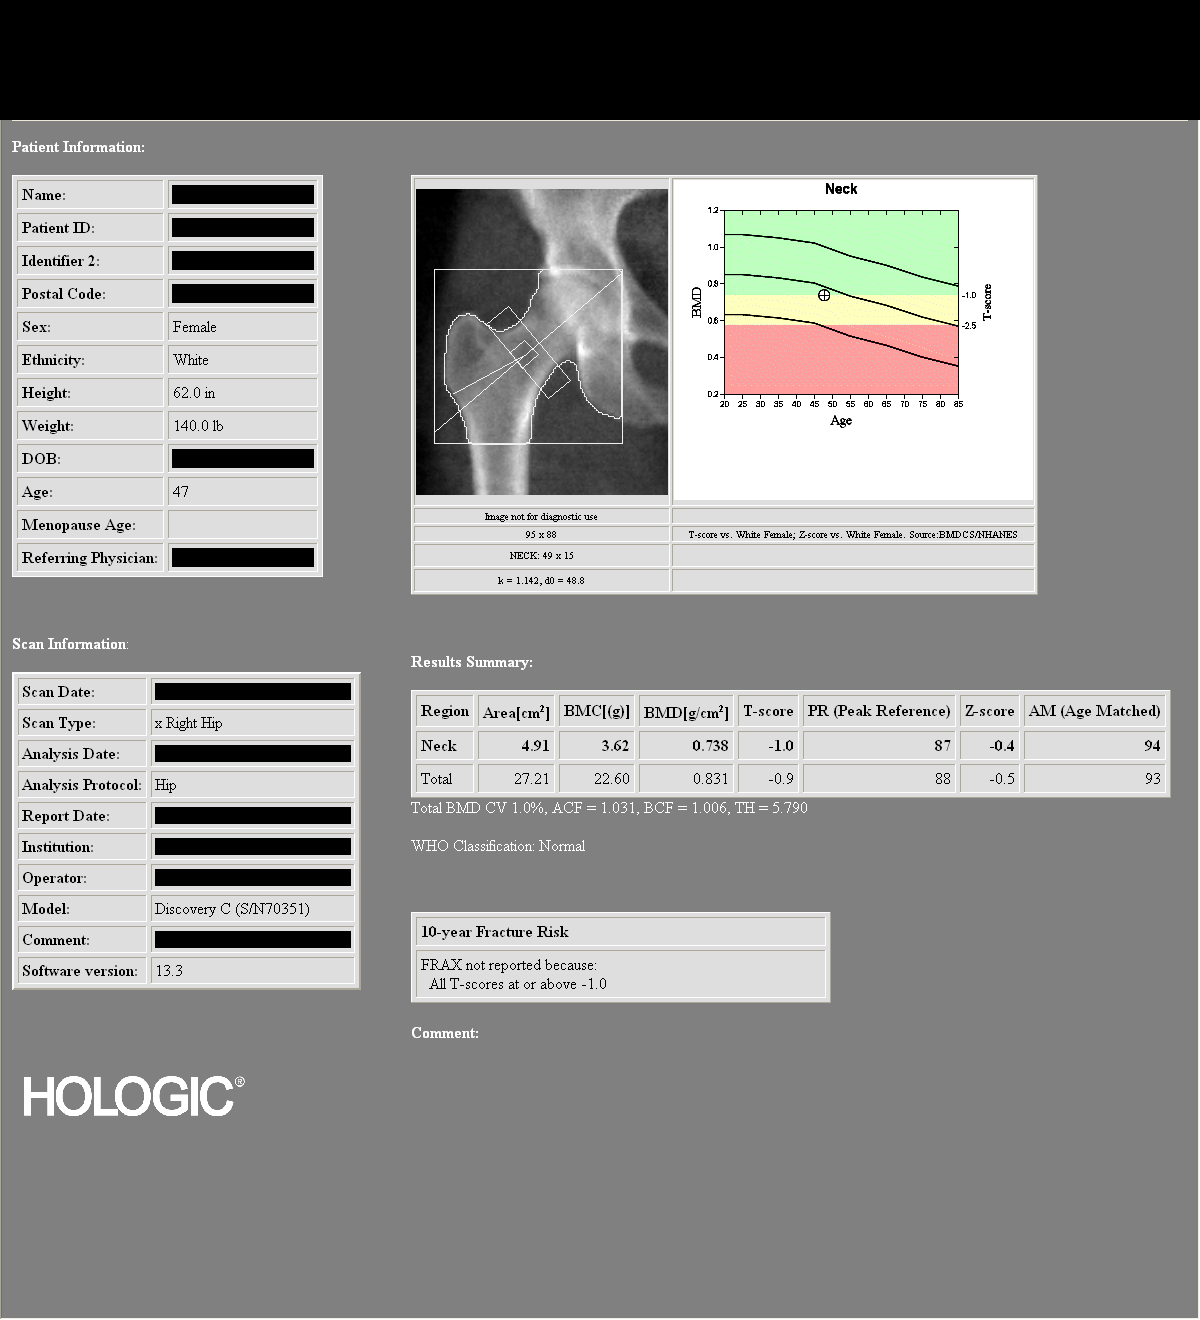

[view not recorded (2 of 3)]
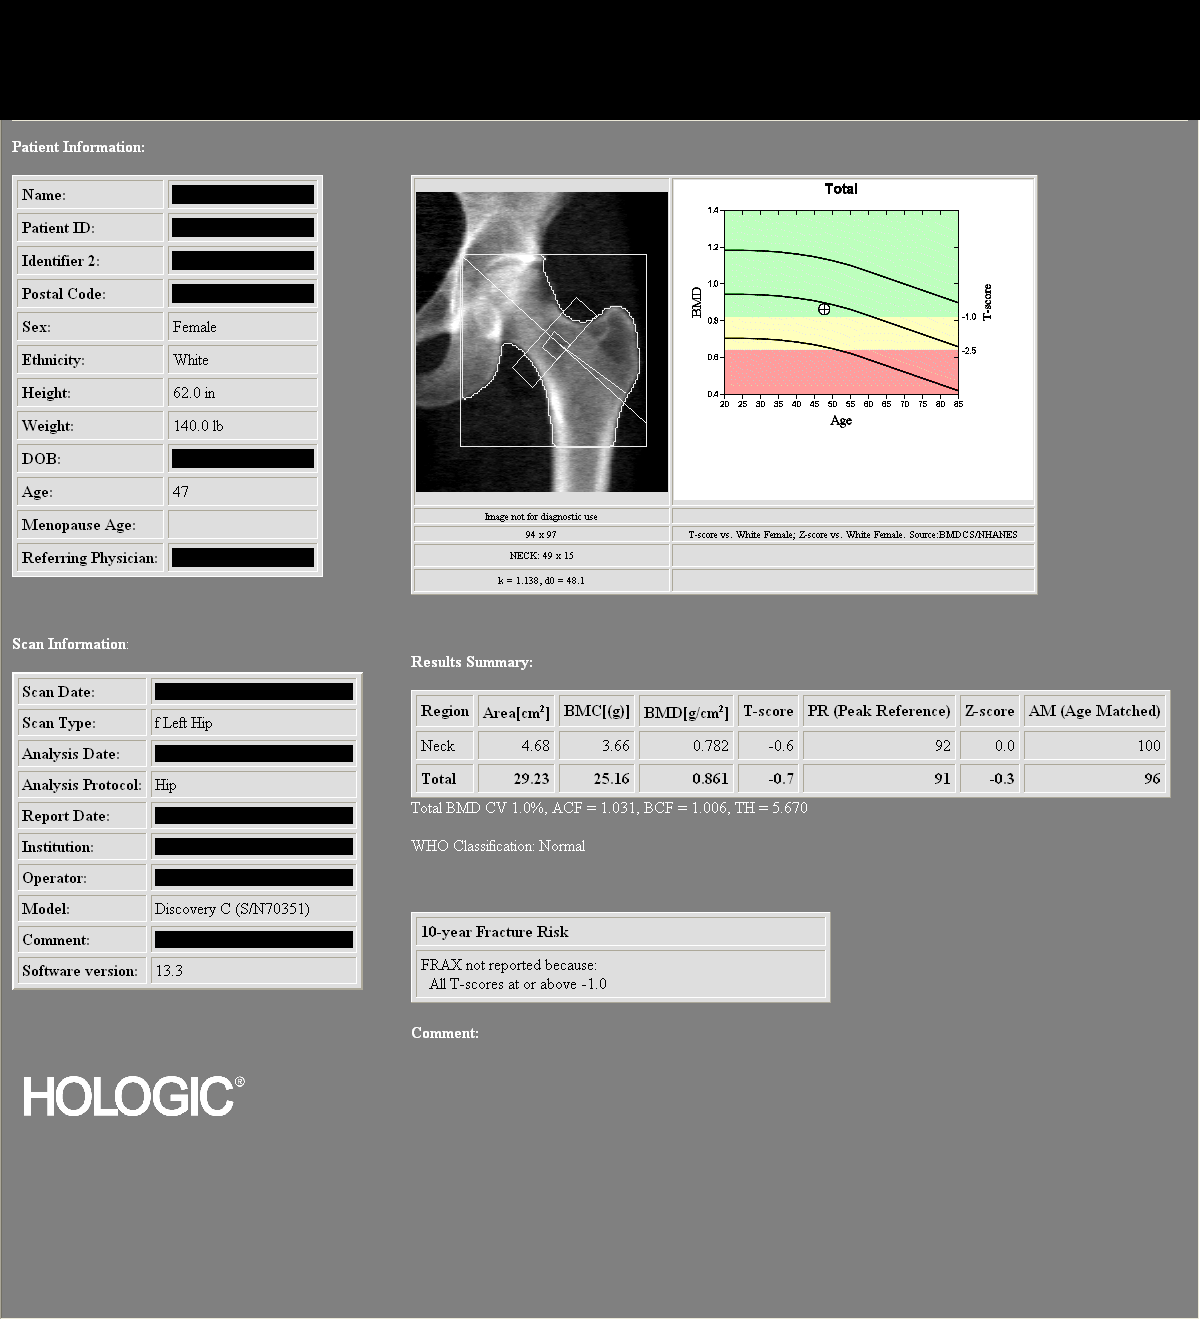

[view not recorded (3 of 3)]
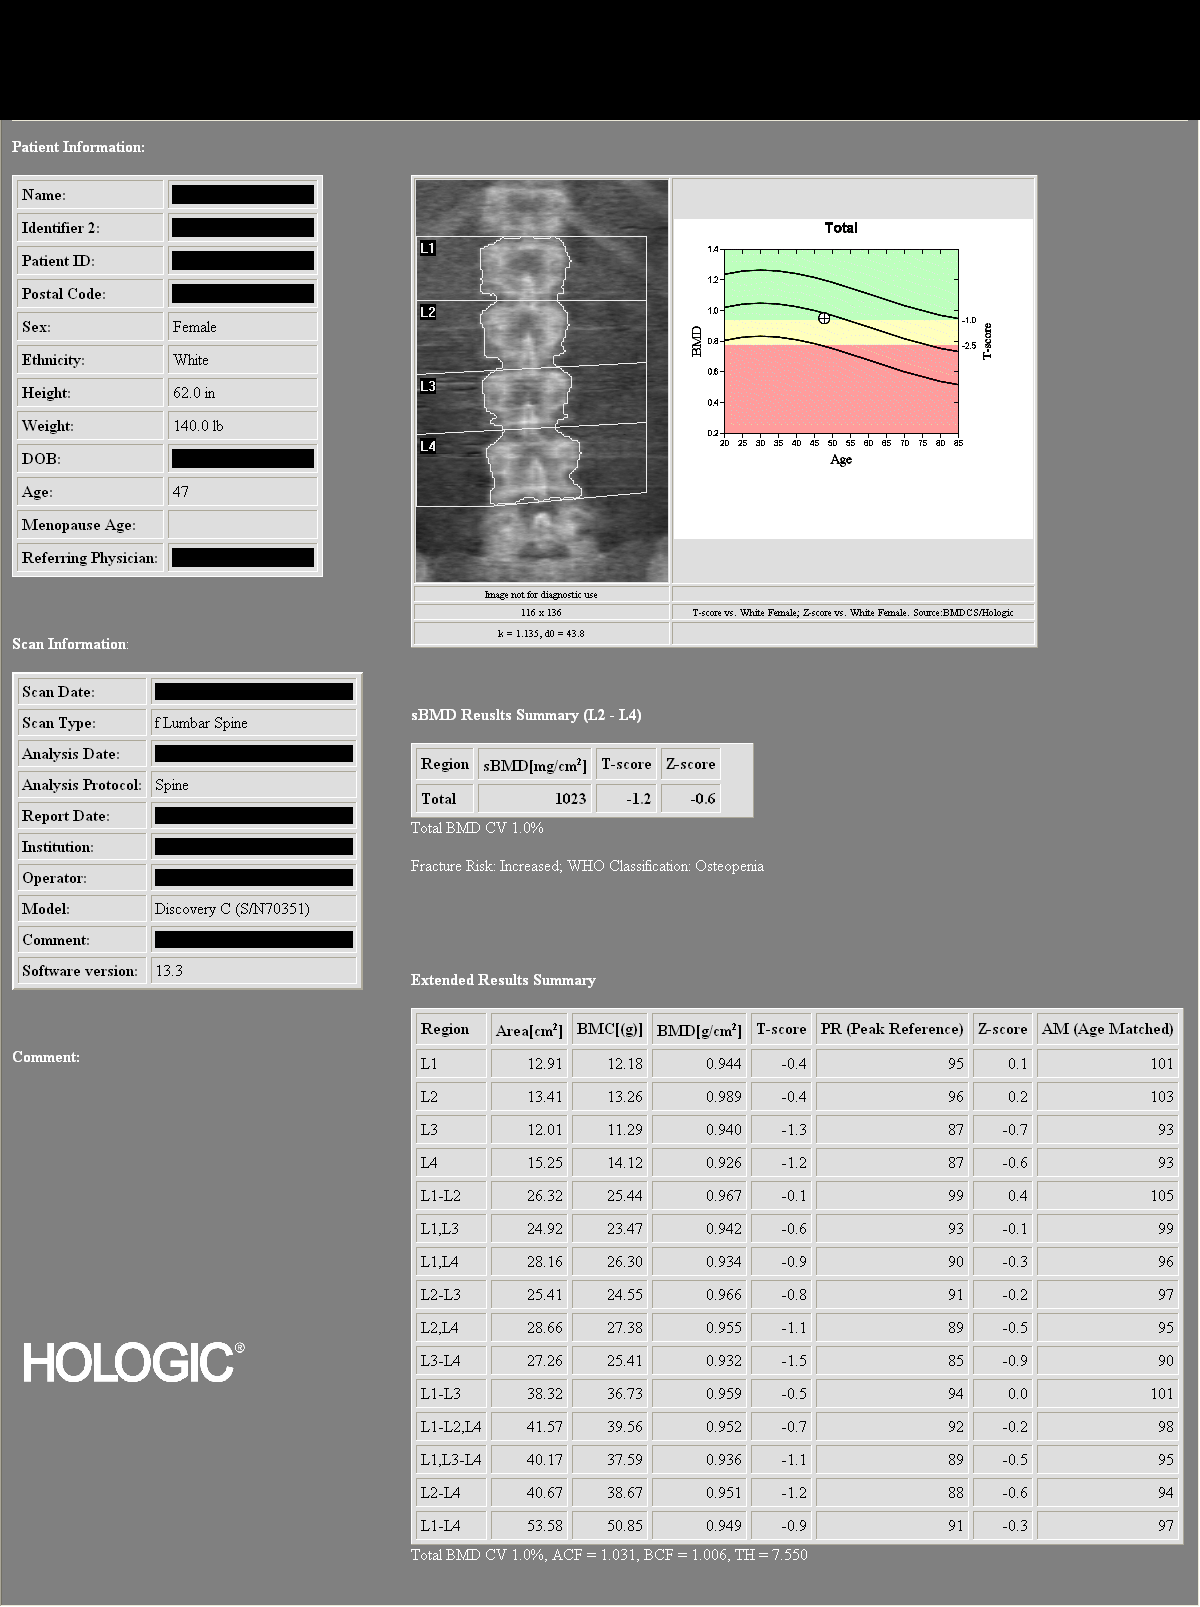

[3 of 3 positions shown; findings below may reference images not displayed]

FINDINGS: AP LUMBAR SPINE L1-L4

Bone Mineral Density (BMD):  0.949 g/cm2

Young Adult T-Score:  -0.9

Z-Score:  -0.3

Left FEMUR total

Bone Mineral Density (BMD):  0.861 g/cm2

Young Adult T-Score: -0.7

Z-Score:  -0.3

Right femoral neck:

Bone mineral density (BMD):  0.738 g/cm2

Young Adult T-Score: -1.0

Z-Score:  -0.4

ASSESSMENT: Patient's diagnostic category is NORMAL by WHO Criteria.

FRACTURE RISK: NOT INCREASED.

FRAX:  Not calculated due to T-score at or above -1.0.
Effective therapies are available in the form of bisphosphonates,
selective estrogen receptor modulators, biologic agents, and hormone
replacement therapy (for women). All patients should ensure an
adequate intake of dietary calcium (0933 mg daily) and vitamin D
(800 Nya Jumper) unless contraindicated.

All treatment decisions require clinical judgment and consideration
of individual patient factors, including patient preferences,
co-morbidities, previous drug use, risk factors not captured in the
FRAX model (e.g., frailty, falls, vitamin D deficiency, increased
bone turnover, interval significant decline in bone density) and
possible under- or over-estimation of fracture risk by FRAX.

The National Osteoporosis Foundation recommends that FDA-approved
medical therapies be considered in postmenopausal women and men age
50 or older with a:

1. Hip or vertebral (clinical or morphometric) fracture.

2. T-score of -2.5 or lower at the spine or hip.

3. Ten-year fracture probability by FRAX of 3% or greater for hip
fracture or 20% or greater for major osteoporotic fracture.

People with diagnosed cases of osteoporosis or at high risk for
fracture should have regular bone mineral density tests. For
patients eligible for Medicare, routine testing is allowed once
every 2 years. The testing frequency can be increased to one year
for patients who have rapidly progressing disease, those who are
receiving or discontinuing medical therapy to restore bone mass, or
have additional risk factors.

World Health Organization (WHO) Criteria:

Normal: T-scores from +1.0 to -1.0

Low Bone Mass (Osteopenia): T-scores between -1.0 and -2.5

Osteoporosis: T-scores -2.5 and below

Comparison to Reference Population:

T-score is the key measure used in the diagnosis of osteoporosis and
relative risk determination for fracture. It provides a value for
bone mass relative to the mean bone mass of a young adult reference
population expressed in terms of standard deviation (SD).

Z-score is the age-matched score showing the patient's values
compared to a population matched for age, sex, and race. This is
also expressed in terms of standard deviation. The patient may have
values that compare favorably to the age-matched values and still be
at increased risk for fracture.

## 2016-12-29 ENCOUNTER — Telehealth: Payer: Self-pay | Admitting: *Deleted

## 2016-12-29 ENCOUNTER — Other Ambulatory Visit: Payer: Self-pay | Admitting: *Deleted

## 2016-12-29 MED ORDER — SCOPOLAMINE 1 MG/3DAYS TD PT72: 1.0000 | MEDICATED_PATCH | TRANSDERMAL | 0 refills | Status: DC

## 2016-12-29 NOTE — Telephone Encounter (Signed)
Pt notified of rx. 

## 2016-12-29 NOTE — Telephone Encounter (Signed)
Ok I sent to [facility].

## 2016-12-29 NOTE — Telephone Encounter (Signed)
Pt left vm yesterday asking for motion sickness patches for a cruise she's going on this sat.

## 2017-03-16 ENCOUNTER — Other Ambulatory Visit: Payer: Self-pay | Admitting: Physician Assistant

## 2017-03-16 DIAGNOSIS — Z0000 Encounter for general adult medical examination without abnormal findings: Secondary | ICD-10-CM

## 2020-03-15 LAB — HM PAP SMEAR: HM Pap smear: NORMAL

## 2020-03-25 LAB — HM MAMMOGRAPHY

## 2020-05-06 LAB — HM MAMMOGRAPHY

## 2021-06-28 ENCOUNTER — Ambulatory Visit (INDEPENDENT_AMBULATORY_CARE_PROVIDER_SITE_OTHER): Payer: Self-pay | Admitting: Physician Assistant

## 2021-06-28 ENCOUNTER — Encounter: Payer: Self-pay | Admitting: Physician Assistant

## 2021-06-28 VITALS — BP 109/71 | HR 98 | Ht 64.0 in | Wt 148.0 lb

## 2021-06-28 DIAGNOSIS — E782 Mixed hyperlipidemia: Secondary | ICD-10-CM

## 2021-06-28 DIAGNOSIS — Z0000 Encounter for general adult medical examination without abnormal findings: Secondary | ICD-10-CM

## 2021-06-28 DIAGNOSIS — Z131 Encounter for screening for diabetes mellitus: Secondary | ICD-10-CM

## 2021-06-28 DIAGNOSIS — Z1159 Encounter for screening for other viral diseases: Secondary | ICD-10-CM

## 2021-06-28 DIAGNOSIS — Z1231 Encounter for screening mammogram for malignant neoplasm of breast: Secondary | ICD-10-CM

## 2021-06-28 DIAGNOSIS — Z13220 Encounter for screening for lipoid disorders: Secondary | ICD-10-CM

## 2021-06-28 DIAGNOSIS — Z780 Asymptomatic menopausal state: Secondary | ICD-10-CM

## 2021-06-28 MED ORDER — PRAVASTATIN SODIUM 40 MG PO TABS: 40.0000 mg | ORAL_TABLET | Freq: Every day | ORAL | 4 refills | Status: AC

## 2021-06-28 MED ORDER — VALACYCLOVIR HCL 1 G PO TABS: ORAL_TABLET | ORAL | 0 refills | Status: DC

## 2021-06-28 NOTE — Patient Instructions (Addendum)
Miralax as needed ? ?Health Maintenance, Female ?Adopting a healthy lifestyle and getting preventive care are important in promoting health and wellness. Ask your health care provider about: ?The right schedule for you to have regular tests and exams. ?Things you can do on your own to prevent diseases and keep yourself healthy. ?What should I know about diet, weight, and exercise? ?Eat a healthy diet ? ?Eat a diet that includes plenty of vegetables, fruits, low-fat dairy products, and lean protein. ?Do not eat a lot of foods that are high in solid fats, added sugars, or sodium. ?Maintain a healthy weight ?Body mass index (BMI) is used to identify weight problems. It estimates body fat based on height and weight. Your health care provider can help determine your BMI and help you achieve or maintain a healthy weight. ?Get regular exercise ?Get regular exercise. This is one of the most important things you can do for your health. Most adults should: ?Exercise for at least 150 minutes each week. The exercise should increase your heart rate and make you sweat (moderate-intensity exercise). ?Do strengthening exercises at least twice a week. This is in addition to the moderate-intensity exercise. ?Spend less time sitting. Even light physical activity can be beneficial. ?Watch cholesterol and blood lipids ?Have your blood tested for lipids and cholesterol at 55 years of age, then have this test every 5 years. ?Have your cholesterol levels checked more often if: ?Your lipid or cholesterol levels are high. ?You are older than 55 years of age. ?You are at high risk for heart disease. ?What should I know about cancer screening? ?Depending on your health history and family history, you may need to have cancer screening at various ages. This may include screening for: ?Breast cancer. ?Cervical cancer. ?Colorectal cancer. ?Skin cancer. ?Lung cancer. ?What should I know about heart disease, diabetes, and high blood pressure? ?Blood  pressure and heart disease ?High blood pressure causes heart disease and increases the risk of stroke. This is more likely to develop in people who have high blood pressure readings or are overweight. ?Have your blood pressure checked: ?Every 3-5 years if you are 46-36 years of age. ?Every year if you are 33 years old or older. ?Diabetes ?Have regular diabetes screenings. This checks your fasting blood sugar level. Have the screening done: ?Once every three years after age 32 if you are at a normal weight and have a low risk for diabetes. ?More often and at a younger age if you are overweight or have a high risk for diabetes. ?What should I know about preventing infection? ?Hepatitis B ?If you have a higher risk for hepatitis B, you should be screened for this virus. Talk with your health care provider to find out if you are at risk for hepatitis B infection. ?Hepatitis C ?Testing is recommended for: ?Everyone born from [birth year] through 1964. ?Anyone with known risk factors for hepatitis C. ?Sexually transmitted infections (STIs) ?Get screened for STIs, including gonorrhea and chlamydia, if: ?You are sexually active and are younger than 55 years of age. ?You are older than 55 years of age and your health care provider tells you that you are at risk for this type of infection. ?Your sexual activity has changed since you were last screened, and you are at increased risk for chlamydia or gonorrhea. Ask your health care provider if you are at risk. ?Ask your health care provider about whether you are at high risk for HIV. Your health care provider may recommend a prescription medicine  to help prevent HIV infection. If you choose to take medicine to prevent HIV, you should first get tested for HIV. You should then be tested every 3 months for as long as you are taking the medicine. ?Pregnancy ?If you are about to stop having your period (premenopausal) and you may become pregnant, seek counseling before you get  pregnant. ?Take 400 to 800 micrograms (mcg) of folic acid every day if you become pregnant. ?Ask for birth control (contraception) if you want to prevent pregnancy. ?Osteoporosis and menopause ?Osteoporosis is a disease in which the bones lose minerals and strength with aging. This can result in bone fractures. If you are 51 years old or older, or if you are at risk for osteoporosis and fractures, ask your health care provider if you should: ?Be screened for bone loss. ?Take a calcium or vitamin D supplement to lower your risk of fractures. ?Be given hormone replacement therapy (HRT) to treat symptoms of menopause. ?Follow these instructions at home: ?Alcohol use ?Do not drink alcohol if: ?Your health care provider tells you not to drink. ?You are pregnant, may be pregnant, or are planning to become pregnant. ?If you drink alcohol: ?Limit how much you have to: ?0-1 drink a day. ?Know how much alcohol is in your drink. In the [place] one drink equals one 12 oz bottle of beer (355 mL), one 5 oz glass of wine (148 mL), or one 1? oz glass of hard liquor (44 mL). ?Lifestyle ?Do not use any products that contain nicotine or tobacco. These products include cigarettes, chewing tobacco, and vaping devices, such as e-cigarettes. If you need help quitting, ask your health care provider. ?Do not use street drugs. ?Do not share needles. ?Ask your health care provider for help if you need support or information about quitting drugs. ?General instructions ?Schedule regular health, dental, and eye exams. ?Stay current with your vaccines. ?Tell your health care provider if: ?You often feel depressed. ?You have ever been abused or do not feel safe at home. ?Summary ?Adopting a healthy lifestyle and getting preventive care are important in promoting health and wellness. ?Follow your health care provider's instructions about healthy diet, exercising, and getting tested or screened for diseases. ?Follow your health care provider's  instructions on monitoring your cholesterol and blood pressure. ?This information is not intended to replace advice given to you by your health care provider. Make sure you discuss any questions you have with your health care provider. ?Document Revised: 06/21/2020 Document Reviewed: 06/21/2020 ?Elsevier Patient Education ? [place]. ? ? ?Constipation, Adult ?Constipation is when a person has fewer than three bowel movements in a week, has difficulty having a bowel movement, or has stools (feces) that are dry, hard, or larger than normal. Constipation may be caused by an underlying condition. It may become worse with age if a person takes certain medicines and does not take in enough fluids. ?Follow these instructions at home: ?Eating and drinking ? ?Eat foods that have a lot of fiber, such as beans, whole grains, and fresh fruits and vegetables. ?Limit foods that are low in fiber and high in fat and processed sugars, such as fried or sweet foods. These include french fries, hamburgers, cookies, candies, and soda. ?Drink enough fluid to keep your urine pale yellow. ?General instructions ?Exercise regularly or as told by your health care provider. Try to do 150 minutes of moderate exercise each week. ?Use the bathroom when you have the urge to go. Do not hold it in. ?  Take over-the-counter and prescription medicines only as told by your health care provider. This includes any fiber supplements. ?During bowel movements: ?Practice deep breathing while relaxing the lower abdomen. ?Practice pelvic floor relaxation. ?Watch your condition for any changes. Let your health care provider know about them. ?Keep all follow-up visits as told by your health care provider. This is important. ?Contact a health care provider if: ?You have pain that gets worse. ?You have a fever. ?You do not have a bowel movement after 4 days. ?You vomit. ?You are not hungry or you lose weight. ?You are bleeding from the opening between the  buttocks (anus). ?You have thin, pencil-like stools. ?Get help right away if: ?You have a fever and your symptoms suddenly get worse. ?You leak stool or have blood in your stool. ?Your abdomen is bloated. ?You have sever

## 2021-06-28 NOTE — Progress Notes (Signed)
? ?Complete physical exam ? ?Patient: Stacie Hanson   DOB: January 15, 1966   56 y.o. Female  MRN: [id number] ? ?Subjective:  ?  ?Chief Complaint  ?Patient presents with  ? Establish Care  ? ? ?Stacie Hanson is a 56 y.o. female who presents today for a complete physical exam. She reports consuming a general diet. The patient has a physically strenuous job, but has no regular exercise apart from work.  She generally feels well. She reports sleeping well. She does not have additional problems to discuss today.  ? ?Intermittent constipation and rectal bleeding.  ?Most recent fall risk assessment: ? ?  06/28/2021  ?  2:55 PM  ?Fall Risk   ?Falls in the past year? 0  ?Number falls in past yr: 0  ?Injury with Fall? 0  ?Risk for fall due to : No Fall Risks  ?Follow up Falls evaluation completed  ? ?  ?Most recent depression screenings: ? ?  06/28/2021  ?  2:55 PM 02/21/2013  ? 12:49 PM  ?PHQ 2/9 Scores  ?PHQ - 2 Score 0 1  ?PHQ- 9 Score 1   ? ? ?Vision:Within last year and Dental: No current dental problems and Receives regular dental care ? ?Patient Active Problem List  ? Diagnosis Date Noted  ? Post-menopausal 06/28/2021  ? Solar lentigo 03/08/2016  ? Seborrheic keratosis 03/08/2016  ? Arthralgia of both hands 03/08/2016  ? Fever blister 02/25/2014  ? Hyperlipidemia 02/25/2014  ? 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 Stress incontinence 02/19/2013  ? Insomnia 02/19/2013  ? Tobacco abuse 02/19/2013  ? ?Past Medical History:  ?Diagnosis Date  ? Hyperlipidemia   ? ?Family History  ?Problem Relation Age of Onset  ? Cancer Mother   ? Heart attack Father   ? Hyperlipidemia Father   ? ?Allergies  ?Allergen Reactions  ? Erythromycin Nausea And Vomiting  ? Tetracyclines & Related Nausea And Vomiting  ? ?  ? ?Patient Care Team: ?Lavada Mesi as PCP - General (Family Medicine) ?Lavada Mesi (Family Medicine)  ? ?Outpatient Medications Prior to Visit  ?Medication Sig  ? [DISCONTINUED] pravastatin (PRAVACHOL) 40 MG tablet Take 1  tablet (40 mg total) by mouth daily.  ? [DISCONTINUED] scopolamine (TRANSDERM-SCOP, 1.5 MG,) 1 MG/3DAYS Place 1 patch (1.5 mg total) every 3 (three) days onto the skin.  ? [DISCONTINUED] valACYclovir (VALTREX) 1000 MG tablet Take 2 tablets twice daily for 1 day per outbreak. (Patient not taking: Reported on 06/28/2021)  ? ?No facility-administered medications prior to visit.  ? ? ?ROS ? ? ? ? ?   ?Objective:  ? ?  ?BP 109/71   Pulse 98   Ht 5\' 4"  (1.626 m)   Wt 148 lb (67.1 kg)   SpO2 98%   BMI 25.40 kg/m?  ?BP Readings from Last 3 Encounters:  ?06/28/21 109/71  ?03/08/16 132/83  ?02/25/14 105/67  ? ?  ? ?Physical Exam  ?BP 109/71   Pulse 98   Ht 5\' 4"  (1.626 m)   Wt 148 lb (67.1 kg)   SpO2 98%   BMI 25.40 kg/m?  ? ?General Appearance:    Alert, obese, cooperative, no distress, appears stated age  ?Head:    Normocephalic, without obvious abnormality, atraumatic  ?Eyes:    PERRL, conjunctiva/corneas clear, EOM's intact, fundi -left eye ?Right eye is prosthetic.  ?   ?Ears:    Normal TM's and external ear canals, both ears  ?Nose:   Nares normal, septum midline, mucosa normal, no  drainage    or sinus tenderness  ?Throat:   Lips, mucosa, and tongue normal; teeth and gums normal  ?Neck:   Supple, symmetrical, trachea midline, no adenopathy;  ?  thyroid:  no enlargement/tenderness/nodules; no carotid ?  bruit or JVD  ?Back:     Symmetric, no curvature, ROM normal, no CVA tenderness  ?Lungs:     Clear to auscultation bilaterally, respirations unlabored  ?Chest Wall:    No tenderness or deformity  ? Heart:    Regular rate and rhythm, S1 and S2 normal, no murmur, rub   or gallop  ?   ?Abdomen:     Soft, non-tender, bowel sounds active all four quadrants,  ?  no masses, no organomegaly  ?   ?   ?Extremities:   Extremities normal, atraumatic, no cyanosis or edema  ?Pulses:   2+ and symmetric all extremities  ?Skin:   Ages spots numerous, Skin color, texture, turgor normal, no rashes or lesions  ?Lymph nodes:    Cervical, supraclavicular, and axillary nodes normal  ?Neurologic:   CNII-XII intact, normal strength, sensation and reflexes  ?  throughout  ? ?   ?Assessment & Plan:  ?  ?Routine Health Maintenance and Physical Exam ? ?Immunization History  ?Administered Date(s) Administered  ? Influenza,inj,Quad PF,6+ Mos 03/08/2016  ? Tdap 02/21/2013  ? ? ?Health Maintenance  ?Topic Date Due  ? Hepatitis C Screening  Never done  ? Zoster Vaccines- Shingrix (1 of 2) Never done  ? MAMMOGRAM  03/08/2017  ? PAP SMEAR-Modifier  03/08/2021  ? COVID-19 Vaccine (1) 07/14/2021 (Originally 11/16/1965)  ? COLONOSCOPY (Pts 45-65yrs Insurance coverage will need to be confirmed)  06/29/2022 (Originally 05/17/2011)  ? HIV Screening  06/29/2022 (Originally 05/16/1980)  ? INFLUENZA VACCINE  09/13/2021  ? TETANUS/TDAP  02/22/2023  ? HPV VACCINES  Aged Out  ? ? ?Discussed health benefits of physical activity, and encouraged her to engage in regular exercise appropriate for her age and condition. ? ?.. ?Discussed 150 minutes of exercise a week.  ?Encouraged vitamin D 1000 units and Calcium 1300mg  or 4 servings of dairy a day.  ?Fasting labs ordered today ?PHQ no concerns ?Declined smoking cessation ?Needs pap ?Mammogram ordered ?Cologuard per patient done last year and negative ?Refilled pravachol, lipid ordered. ?Covid vaccine per patient x3 ?Declined shingrix and flu shot ?Vitals looks great ?Discussed constipation-miralax as needed if bleeding not stopping or would like evlauated needs colonoscopy and GI referral. Pt declines today.  ?Follow up in 1year ? ? ?Return in about 1 year (around 06/29/2022), or if symptoms worsen or fail to improve. ? ?  ? ?Iran Planas, PA-C ? ? ?

## 2021-06-30 ENCOUNTER — Other Ambulatory Visit: Payer: Self-pay | Admitting: Physician Assistant

## 2021-07-01 LAB — COMPREHENSIVE METABOLIC PANEL
ALT: 20 IU/L (ref 0–32)
AST: 21 IU/L (ref 0–40)
Albumin/Globulin Ratio: 1.5 (ref 1.2–2.2)
Albumin: 4.3 g/dL (ref 3.8–4.9)
Alkaline Phosphatase: 113 IU/L (ref 44–121)
BUN/Creatinine Ratio: 19 (ref 9–23)
BUN: 15 mg/dL (ref 6–24)
Bilirubin Total: 0.3 mg/dL (ref 0.0–1.2)
CO2: 24 mmol/L (ref 20–29)
Calcium: 9.7 mg/dL (ref 8.7–10.2)
Chloride: 103 mmol/L (ref 96–106)
Creatinine, Ser: 0.8 mg/dL (ref 0.57–1.00)
Globulin, Total: 2.8 g/dL (ref 1.5–4.5)
Glucose: 108 mg/dL — ABNORMAL HIGH (ref 70–99)
Potassium: 4.3 mmol/L (ref 3.5–5.2)
Sodium: 144 mmol/L (ref 134–144)
Total Protein: 7.1 g/dL (ref 6.0–8.5)
eGFR: 87 mL/min/{1.73_m2} (ref >59)

## 2021-07-01 LAB — VITAMIN D 25 HYDROXY (VIT D DEFICIENCY, FRACTURES): Vit D, 25-Hydroxy: 34.3 ng/mL (ref 30.0–100.0)

## 2021-07-01 LAB — LIPID PANEL WITH LDL/HDL RATIO
Cholesterol, Total: 191 mg/dL (ref 100–199)
HDL: 44 mg/dL (ref >39)
LDL Chol Calc (NIH): 111 mg/dL — ABNORMAL HIGH (ref 0–99)
LDL/HDL Ratio: 2.5 ratio (ref 0.0–3.2)
Triglycerides: 208 mg/dL — ABNORMAL HIGH (ref 0–149)
VLDL Cholesterol Cal: 36 mg/dL (ref 5–40)

## 2021-07-01 LAB — TSH: TSH: 2.81 u[IU]/mL (ref 0.450–4.500)

## 2021-07-01 LAB — HEPATITIS C ANTIBODY: Hep C Virus Ab: NONREACTIVE

## 2021-07-01 LAB — SPECIMEN STATUS REPORT

## 2021-07-04 NOTE — Progress Notes (Signed)
Kidney and liver look great.  Glucose is elevated. We need to add A1C to these labs to evaluate for diabetes.  Vitamin D is normal but very low normal. Increase daily vitamin D by 1000 units.  LDL is 111. No quite to optimal but still looks very good.  TG are up. Start watching sugars and carbs and limiting them. Ok to start a daily fish oil supplement as well.

## 2021-07-19 ENCOUNTER — Ambulatory Visit (INDEPENDENT_AMBULATORY_CARE_PROVIDER_SITE_OTHER): Payer: Self-pay | Admitting: Physician Assistant

## 2021-07-19 ENCOUNTER — Other Ambulatory Visit (HOSPITAL_COMMUNITY)
Admission: RE | Admit: 2021-07-19 | Discharge: 2021-07-19 | Disposition: A | Discharge status: Home / Self Care | Payer: Self-pay | Source: Ambulatory Visit | Attending: Physician Assistant | Admitting: Physician Assistant

## 2021-07-19 ENCOUNTER — Encounter: Payer: Self-pay | Admitting: Physician Assistant

## 2021-07-19 VITALS — BP 123/92 | HR 89 | Ht 64.0 in | Wt 150.0 lb

## 2021-07-19 DIAGNOSIS — Z124 Encounter for screening for malignant neoplasm of cervix: Secondary | ICD-10-CM | POA: Insufficient documentation

## 2021-07-19 DIAGNOSIS — R7301 Impaired fasting glucose: Secondary | ICD-10-CM

## 2021-07-19 DIAGNOSIS — E559 Vitamin D deficiency, unspecified: Secondary | ICD-10-CM

## 2021-07-19 LAB — POCT GLYCOSYLATED HEMOGLOBIN (HGB A1C): Hemoglobin A1C: 5.3 % (ref 4.0–5.6)

## 2021-07-19 NOTE — Progress Notes (Signed)
Subjective:     Stacie Hanson is a 56 y.o. woman who comes in today for a  pap smear only. Her most recent annual exam was on 06/2021. Her most recent Pap smear was on 03/15/2020 and showed no abnormalities. Previous abnormal Pap smears: no. Contraception: vasectomy  The following portions of the patient's history were reviewed and updated as appropriate: allergies, current medications, past family history, past medical history, past surgical history, and problem list.  Review of Systems A comprehensive review of systems was negative.   Objective:    BP (!) 123/92   Pulse 89   Ht 5\' 4"  (1.626 m)   Wt 150 lb (68 kg)   SpO2 99%   BMI 25.75 kg/m  Pelvic Exam: cervix normal in appearance, external genitalia normal, vagina normal without discharge, and atrophic vaginitis . Pap smear obtained.   .. Results for orders placed or performed in visit on 07/19/21  POCT HgB A1C  Result Value Ref Range   Hemoglobin A1C 5.3 4.0 - 5.6 %   HbA1c POC (<> result, manual entry)     HbA1c, POC (prediabetic range)     HbA1c, POC (controlled diabetic range)      Assessment:    Screening pap smear.   Plan:  08/06/23Marland KitchenDangela was seen today for gynecologic exam.  Diagnoses and all orders for this visit:  Routine Papanicolaou smear -     Cytology - PAP  Elevated fasting glucose  Vitamin D insufficiency   Reminded patient to start vitamin D 1000 units daily.  A1C 5.3 and looks great.  Discussed diabetes prevention with diet and exercise.     Follow up in 3 to 5 years, or as indicated by Pap results.

## 2021-07-21 LAB — CYTOLOGY - PAP
Adequacy: ABSENT
Comment: NEGATIVE
Diagnosis: NEGATIVE
High risk HPV: NEGATIVE

## 2021-07-22 NOTE — Progress Notes (Signed)
No abnormal cells.  Negative for HPV. Follow up in 5 years.

## 2021-07-28 ENCOUNTER — Ambulatory Visit: Payer: Self-pay

## 2021-11-10 ENCOUNTER — Encounter: Payer: Self-pay | Admitting: Physician Assistant

## 2021-11-14 ENCOUNTER — Ambulatory Visit (INDEPENDENT_AMBULATORY_CARE_PROVIDER_SITE_OTHER): Payer: Self-pay | Admitting: Physician Assistant

## 2021-11-14 VITALS — BP 129/89 | HR 112 | Ht 64.0 in | Wt 146.0 lb

## 2021-11-14 DIAGNOSIS — R197 Diarrhea, unspecified: Secondary | ICD-10-CM

## 2021-11-14 DIAGNOSIS — R1032 Left lower quadrant pain: Secondary | ICD-10-CM

## 2021-11-14 DIAGNOSIS — R112 Nausea with vomiting, unspecified: Secondary | ICD-10-CM

## 2021-11-14 DIAGNOSIS — K625 Hemorrhage of anus and rectum: Secondary | ICD-10-CM

## 2021-11-14 LAB — POC COVID19 BINAXNOW: SARS Coronavirus 2 Ag: NEGATIVE

## 2021-11-14 NOTE — Progress Notes (Signed)
Acute Office Visit  Subjective:     Patient ID: Stacie Hanson, female    DOB: Jul 03, 1965, 56 y.o.   MRN: [id number]  Chief Complaint  Patient presents with   Rectal Bleeding    HPI Patient is in today for rectal bleeding. On Saturday night she woke up and vomited once with diarrhea. She noticed some blood that night and since then she has some blood when she passes gas. She has not had another bowel movement. She has some left lower quadrant tenderness and cramping. No fever, chills, body aches, SOB. She has not taken anything for symptoms. She is concerned with rectal bleeding.   She has not had colonoscopy but she reports doing a cologuard in [place] that was negative last year.   .. 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      Objective:    There were no vitals taken for this visit. BP Readings from Last 3 Encounters:  11/14/21 129/89  07/19/21 (!) 123/92  06/28/21 109/71   Wt Readings from Last 3 Encounters:  11/14/21 146 lb (66.2 kg)  07/19/21 150 lb (68 kg)  06/28/21 148 lb (67.1 kg)      Physical Exam Constitutional:      Appearance: Normal appearance. She is obese.  HENT:     Head: Normocephalic.  Neck:     Vascular: No carotid bruit.  Cardiovascular:     Rate and Rhythm: Normal rate and regular  rhythm.     Pulses: Normal pulses.  Pulmonary:     Effort: Pulmonary effort is normal.  Abdominal:     General: Bowel sounds are normal. There is no distension.     Palpations: Abdomen is soft. There is no mass.     Tenderness: There is abdominal tenderness. There is no right CVA tenderness, left CVA tenderness, guarding or rebound.     Hernia: No hernia is present.     Comments: Mild tenderness over left lower quadrant  Genitourinary:    General: Normal vulva.     Vagina: No vaginal discharge.     Rectum: Guaiac result positive.     Comments: No external hemorrhoids or fissures noted.  Musculoskeletal:     Cervical back: Neck supple. No rigidity or tenderness.     Right lower leg: No edema.     Left lower leg: No edema.  Lymphadenopathy:     Cervical: No cervical adenopathy.  Neurological:     General: No focal deficit present.     Mental Status: She is alert and oriented to person, place, and time.  Psychiatric:        Mood and Affect: Mood normal.          Assessment & Plan:  Marland KitchenMarland KitchenStarletta was seen today for rectal bleeding.  Diagnoses and all orders for  this visit:  Rectal bleeding -     CBC w/Diff/Platelet -     COMPLETE METABOLIC PANEL WITH GFR -     Lipase  Nausea and vomiting, unspecified vomiting type -     POC COVID-19  Diarrhea, unspecified type -     POC COVID-19  Left lower quadrant abdominal pain -     CBC w/Diff/Platelet -     COMPLETE METABOLIC PANEL WITH GFR -     Lipase -     CT ABDOMEN PELVIS W CONTRAST; Future   Unclear etiology of rectal bleeding CBC/CMP/Lipase ordered today CT scan ordered Negative for covid Per patient negative cologuard last year No external sources of bleeding I do think patient need colonoscopy if no cause identified in CT scan.   Stacie Planas, PA-C

## 2021-11-15 ENCOUNTER — Encounter: Payer: Self-pay | Admitting: Physician Assistant

## 2021-11-15 ENCOUNTER — Ambulatory Visit (INDEPENDENT_AMBULATORY_CARE_PROVIDER_SITE_OTHER): Payer: Self-pay

## 2021-11-15 DIAGNOSIS — R197 Diarrhea, unspecified: Secondary | ICD-10-CM | POA: Insufficient documentation

## 2021-11-15 DIAGNOSIS — K625 Hemorrhage of anus and rectum: Secondary | ICD-10-CM | POA: Insufficient documentation

## 2021-11-15 DIAGNOSIS — R1032 Left lower quadrant pain: Secondary | ICD-10-CM | POA: Insufficient documentation

## 2021-11-15 DIAGNOSIS — K921 Melena: Secondary | ICD-10-CM

## 2021-11-15 DIAGNOSIS — R112 Nausea with vomiting, unspecified: Secondary | ICD-10-CM | POA: Insufficient documentation

## 2021-11-15 LAB — CBC WITH DIFFERENTIAL/PLATELET
Absolute Monocytes: 742 cells/uL (ref 200–950)
Basophils Absolute: 85 cells/uL (ref 0–200)
Basophils Relative: 0.8 %
Eosinophils Absolute: 371 cells/uL (ref 15–500)
Eosinophils Relative: 3.5 %
HCT: 41 % (ref 35.0–45.0)
Hemoglobin: 14 g/dL (ref 11.7–15.5)
Lymphs Abs: 2639 cells/uL (ref 850–3900)
MCH: 32.6 pg (ref 27.0–33.0)
MCHC: 34.1 g/dL (ref 32.0–36.0)
MCV: 95.6 fL (ref 80.0–100.0)
MPV: 10.4 fL (ref 7.5–12.5)
Monocytes Relative: 7 %
Neutro Abs: 6763 cells/uL (ref 1500–7800)
Neutrophils Relative %: 63.8 %
Platelets: 271 10*3/uL (ref 140–400)
RBC: 4.29 10*6/uL (ref 3.80–5.10)
RDW: 12.7 % (ref 11.0–15.0)
Total Lymphocyte: 24.9 %
WBC: 10.6 10*3/uL (ref 3.8–10.8)

## 2021-11-15 LAB — COMPLETE METABOLIC PANEL WITH GFR
AG Ratio: 1.4 (calc) (ref 1.0–2.5)
ALT: 15 U/L (ref 6–29)
AST: 14 U/L (ref 10–35)
Albumin: 4 g/dL (ref 3.6–5.1)
Alkaline phosphatase (APISO): 93 U/L (ref 37–153)
BUN: 11 mg/dL (ref 7–25)
CO2: 30 mmol/L (ref 20–32)
Calcium: 9.3 mg/dL (ref 8.6–10.4)
Chloride: 103 mmol/L (ref 98–110)
Creat: 0.82 mg/dL (ref 0.50–1.03)
Globulin: 2.9 g/dL (calc) (ref 1.9–3.7)
Glucose, Bld: 97 mg/dL (ref 65–99)
Potassium: 4 mmol/L (ref 3.5–5.3)
Sodium: 140 mmol/L (ref 135–146)
Total Bilirubin: 0.4 mg/dL (ref 0.2–1.2)
Total Protein: 6.9 g/dL (ref 6.1–8.1)
eGFR: 84 mL/min/{1.73_m2} (ref >=60)

## 2021-11-15 LAB — LIPASE: Lipase: 31 U/L (ref 7–60)

## 2021-11-15 MED ORDER — IOHEXOL 300 MG/ML  SOLN
100.0000 mL | Freq: Once | INTRAMUSCULAR | Status: AC | PRN
  Administered 2021-11-15: 100 mL via INTRAVENOUS

## 2021-11-15 NOTE — Progress Notes (Signed)
Labs in normal range. Proceed with CT scan.

## 2021-11-17 ENCOUNTER — Telehealth: Payer: Self-pay

## 2021-11-17 ENCOUNTER — Other Ambulatory Visit: Payer: Self-pay | Admitting: Physician Assistant

## 2021-11-17 DIAGNOSIS — A09 Infectious gastroenteritis and colitis, unspecified: Secondary | ICD-10-CM

## 2021-11-17 MED ORDER — METRONIDAZOLE 500 MG PO TABS: 500.0000 mg | ORAL_TABLET | Freq: Three times a day (TID) | ORAL | 0 refills | Status: AC

## 2021-11-17 MED ORDER — CIPROFLOXACIN HCL 500 MG PO TABS: 500.0000 mg | ORAL_TABLET | Freq: Two times a day (BID) | ORAL | 0 refills | Status: AC

## 2021-11-17 NOTE — Telephone Encounter (Signed)
Patient called and left a voice mail message requesting clarification on meds that were prescribed today due to Ct results of infectious colitis. Patient left phone number of [phone]. Attempted to Returned patient call-left voice mail requesting a call back

## 2021-11-17 NOTE — Progress Notes (Signed)
CT shows inflammatory colitis- start cipro and metronidazole. I sent to pharmacy.   Non-obstructing stone in right kidney.    Follow up in 2 weeks.

## 2021-11-22 ENCOUNTER — Telehealth: Payer: Self-pay

## 2021-11-22 NOTE — Telephone Encounter (Signed)
Pateint called and left a voice mail message - states she is having  chemical taste with abx - has 5 more days of abx- wanted to know if anything can be done regarding this. She also mentioned she was having some constipation but she has gotten this under control. She is very frustrated about the chemical taste in mouth.

## 2021-11-22 NOTE — Telephone Encounter (Signed)
Unfortunately it is a pretty nasty tasting drug.  I would just recommend taking it with maybe a little bit of food in the mouth.  You can rinse or gargle afterwards to try to get the taste out.

## 2021-11-23 NOTE — Telephone Encounter (Signed)
Patient made aware.

## 2022-01-03 ENCOUNTER — Other Ambulatory Visit: Payer: Self-pay | Admitting: Physician Assistant

## 2022-08-23 ENCOUNTER — Encounter (HOSPITAL_BASED_OUTPATIENT_CLINIC_OR_DEPARTMENT_OTHER): Payer: Self-pay | Admitting: Physician Assistant

## 2022-08-23 NOTE — Telephone Encounter (Signed)
Refill request for valacyclovir 1gm tablet. Stacie Hanson, please advise if you are okay with [place] refilling med.
